# Patient Record
Sex: Male | Born: 1955
Health system: Southern US, Community
[De-identification: ages and names within clinical notes are randomized; demographics above are authoritative.]

## PROBLEM LIST (undated history)

## (undated) DIAGNOSIS — E785 Hyperlipidemia, unspecified: Secondary | ICD-10-CM

## (undated) DIAGNOSIS — K760 Fatty (change of) liver, not elsewhere classified: Secondary | ICD-10-CM

## (undated) DIAGNOSIS — I714 Abdominal aortic aneurysm, without rupture, unspecified: Secondary | ICD-10-CM

## (undated) DIAGNOSIS — E119 Type 2 diabetes mellitus without complications: Secondary | ICD-10-CM

## (undated) DIAGNOSIS — I2510 Atherosclerotic heart disease of native coronary artery without angina pectoris: Secondary | ICD-10-CM

## (undated) DIAGNOSIS — R7303 Prediabetes: Secondary | ICD-10-CM

## (undated) DIAGNOSIS — I2699 Other pulmonary embolism without acute cor pulmonale: Secondary | ICD-10-CM

## (undated) DIAGNOSIS — I723 Aneurysm of iliac artery: Secondary | ICD-10-CM

## (undated) HISTORY — PX: KNEE SURGERY: SHX244

## (undated) HISTORY — DX: Abdominal aortic aneurysm, without rupture: I71.4

## (undated) HISTORY — DX: Hyperlipidemia, unspecified: E78.5

## (undated) HISTORY — DX: Fatty (change of) liver, not elsewhere classified: K76.0

## (undated) HISTORY — DX: Prediabetes: R73.03

## (undated) HISTORY — DX: Aneurysm of iliac artery: I72.3

## (undated) HISTORY — DX: Abdominal aortic aneurysm, without rupture, unspecified: I71.40

---

## 2000-12-05 ENCOUNTER — Emergency Department (HOSPITAL_COMMUNITY): Admission: EM | Admit: 2000-12-05 | Discharge: 2000-12-05 | Payer: Self-pay | Admitting: Emergency Medicine

## 2000-12-05 ENCOUNTER — Encounter: Payer: Self-pay | Admitting: Emergency Medicine

## 2001-01-04 ENCOUNTER — Ambulatory Visit (HOSPITAL_BASED_OUTPATIENT_CLINIC_OR_DEPARTMENT_OTHER): Admission: RE | Admit: 2001-01-04 | Discharge: 2001-01-04 | Payer: Self-pay | Admitting: *Deleted

## 2015-03-08 ENCOUNTER — Emergency Department (HOSPITAL_COMMUNITY): Payer: Self-pay

## 2015-03-08 ENCOUNTER — Inpatient Hospital Stay (HOSPITAL_COMMUNITY)
Admission: EM | Admit: 2015-03-08 | Discharge: 2015-03-09 | DRG: 247 | Disposition: A | Discharge status: Home / Self Care | Payer: Self-pay | Attending: Internal Medicine | Admitting: Internal Medicine

## 2015-03-08 ENCOUNTER — Encounter (HOSPITAL_COMMUNITY)
Admission: EM | Disposition: A | Discharge status: Home / Self Care | Payer: Self-pay | Source: Home / Self Care | Attending: Internal Medicine

## 2015-03-08 ENCOUNTER — Encounter (HOSPITAL_COMMUNITY): Payer: Self-pay | Admitting: *Deleted

## 2015-03-08 DIAGNOSIS — Z87891 Personal history of nicotine dependence: Secondary | ICD-10-CM

## 2015-03-08 DIAGNOSIS — G4733 Obstructive sleep apnea (adult) (pediatric): Secondary | ICD-10-CM | POA: Diagnosis present

## 2015-03-08 DIAGNOSIS — R0602 Shortness of breath: Secondary | ICD-10-CM

## 2015-03-08 DIAGNOSIS — I200 Unstable angina: Secondary | ICD-10-CM

## 2015-03-08 DIAGNOSIS — R0600 Dyspnea, unspecified: Secondary | ICD-10-CM | POA: Diagnosis present

## 2015-03-08 DIAGNOSIS — Z955 Presence of coronary angioplasty implant and graft: Secondary | ICD-10-CM

## 2015-03-08 DIAGNOSIS — Z9119 Patient's noncompliance with other medical treatment and regimen: Secondary | ICD-10-CM | POA: Diagnosis present

## 2015-03-08 DIAGNOSIS — Z79899 Other long term (current) drug therapy: Secondary | ICD-10-CM

## 2015-03-08 DIAGNOSIS — I2510 Atherosclerotic heart disease of native coronary artery without angina pectoris: Secondary | ICD-10-CM

## 2015-03-08 DIAGNOSIS — Z6841 Body Mass Index (BMI) 40.0 and over, adult: Secondary | ICD-10-CM

## 2015-03-08 DIAGNOSIS — J449 Chronic obstructive pulmonary disease, unspecified: Secondary | ICD-10-CM | POA: Diagnosis present

## 2015-03-08 DIAGNOSIS — I25110 Atherosclerotic heart disease of native coronary artery with unstable angina pectoris: Principal | ICD-10-CM | POA: Diagnosis present

## 2015-03-08 DIAGNOSIS — E785 Hyperlipidemia, unspecified: Secondary | ICD-10-CM | POA: Diagnosis present

## 2015-03-08 DIAGNOSIS — Z9861 Coronary angioplasty status: Secondary | ICD-10-CM

## 2015-03-08 DIAGNOSIS — E119 Type 2 diabetes mellitus without complications: Secondary | ICD-10-CM | POA: Diagnosis present

## 2015-03-08 DIAGNOSIS — I2699 Other pulmonary embolism without acute cor pulmonale: Secondary | ICD-10-CM

## 2015-03-08 DIAGNOSIS — Z8249 Family history of ischemic heart disease and other diseases of the circulatory system: Secondary | ICD-10-CM

## 2015-03-08 DIAGNOSIS — Z7982 Long term (current) use of aspirin: Secondary | ICD-10-CM

## 2015-03-08 HISTORY — PX: CARDIAC CATHETERIZATION: SHX172

## 2015-03-08 HISTORY — DX: Atherosclerotic heart disease of native coronary artery without angina pectoris: I25.10

## 2015-03-08 HISTORY — DX: Other pulmonary embolism without acute cor pulmonale: I26.99

## 2015-03-08 LAB — BRAIN NATRIURETIC PEPTIDE: B Natriuretic Peptide: 5.7 pg/mL (ref 0.0–100.0)

## 2015-03-08 LAB — CBC
HCT: 41.9 % (ref 39.0–52.0)
Hemoglobin: 14.2 g/dL (ref 13.0–17.0)
MCH: 29.8 pg (ref 26.0–34.0)
MCHC: 33.9 g/dL (ref 30.0–36.0)
MCV: 88 fL (ref 78.0–100.0)
PLATELETS: 170 10*3/uL (ref 150–400)
RBC: 4.76 MIL/uL (ref 4.22–5.81)
RDW: 13.7 % (ref 11.5–15.5)
WBC: 7.2 10*3/uL (ref 4.0–10.5)

## 2015-03-08 LAB — HEPATIC FUNCTION PANEL
ALBUMIN: 3.9 g/dL (ref 3.5–5.0)
ALT: 45 U/L (ref 17–63)
AST: 28 U/L (ref 15–41)
Alkaline Phosphatase: 74 U/L (ref 38–126)
BILIRUBIN TOTAL: 1.3 mg/dL — AB (ref 0.3–1.2)
Bilirubin, Direct: 0.2 mg/dL (ref 0.1–0.5)
Indirect Bilirubin: 1.1 mg/dL — ABNORMAL HIGH (ref 0.3–0.9)
Total Protein: 7.2 g/dL (ref 6.5–8.1)

## 2015-03-08 LAB — BASIC METABOLIC PANEL
Anion gap: 8 (ref 5–15)
BUN: 14 mg/dL (ref 6–20)
CALCIUM: 9.4 mg/dL (ref 8.9–10.3)
CHLORIDE: 102 mmol/L (ref 101–111)
CO2: 25 mmol/L (ref 22–32)
CREATININE: 0.86 mg/dL (ref 0.61–1.24)
GFR calc Af Amer: >60 mL/min (ref >60)
GFR calc non Af Amer: >60 mL/min (ref >60)
GLUCOSE: 113 mg/dL — AB (ref 65–99)
Potassium: 4.2 mmol/L (ref 3.5–5.1)
Sodium: 135 mmol/L (ref 135–145)

## 2015-03-08 LAB — TROPONIN I
Troponin I: <0.03 ng/mL (ref <0.031)
Troponin I: <0.03 ng/mL (ref <0.031)

## 2015-03-08 LAB — I-STAT TROPONIN, ED: TROPONIN I, POC: 0 ng/mL (ref 0.00–0.08)

## 2015-03-08 LAB — MRSA PCR SCREENING: MRSA BY PCR: NEGATIVE

## 2015-03-08 SURGERY — LEFT HEART CATH AND CORONARY ANGIOGRAPHY
Anesthesia: LOCAL

## 2015-03-08 MED ORDER — ATORVASTATIN CALCIUM 80 MG PO TABS: 80.0000 mg | ORAL_TABLET | Freq: Every day | ORAL | Status: DC

## 2015-03-08 MED ORDER — NITROGLYCERIN 0.4 MG SL SUBL
0.4000 mg | SUBLINGUAL_TABLET | SUBLINGUAL | Status: DC | PRN
  Administered 2015-03-08: 0.4 mg via SUBLINGUAL

## 2015-03-08 MED ORDER — VERAPAMIL HCL 2.5 MG/ML IV SOLN
INTRAVENOUS | Status: DC | PRN
  Administered 2015-03-08: 10 mL via INTRA_ARTERIAL

## 2015-03-08 MED ORDER — SODIUM CHLORIDE 0.9 % IJ SOLN: 3.0000 mL | INTRAMUSCULAR | Status: DC | PRN

## 2015-03-08 MED ORDER — MIDAZOLAM HCL 2 MG/2ML IJ SOLN
INTRAMUSCULAR | Status: DC | PRN
  Administered 2015-03-08: 1 mg via INTRAVENOUS

## 2015-03-08 MED ORDER — FENTANYL CITRATE (PF) 100 MCG/2ML IJ SOLN
INTRAMUSCULAR | Status: AC
  Filled 2015-03-08: qty 4

## 2015-03-08 MED ORDER — ACETAMINOPHEN 650 MG RE SUPP: 650.0000 mg | Freq: Four times a day (QID) | RECTAL | Status: DC | PRN

## 2015-03-08 MED ORDER — SODIUM CHLORIDE 0.9 % IV SOLN
250.0000 mg | INTRAVENOUS | Status: DC | PRN
  Administered 2015-03-08: 1.75 mg/kg/h via INTRAVENOUS

## 2015-03-08 MED ORDER — BIVALIRUDIN 250 MG IV SOLR
INTRAVENOUS | Status: AC
  Filled 2015-03-08: qty 250

## 2015-03-08 MED ORDER — ENOXAPARIN SODIUM 40 MG/0.4ML ~~LOC~~ SOLN: 40.0000 mg | SUBCUTANEOUS | Status: DC

## 2015-03-08 MED ORDER — IPRATROPIUM-ALBUTEROL 0.5-2.5 (3) MG/3ML IN SOLN
3.0000 mL | Freq: Once | RESPIRATORY_TRACT | Status: AC
  Administered 2015-03-08: 3 mL via RESPIRATORY_TRACT
  Filled 2015-03-08: qty 3

## 2015-03-08 MED ORDER — ONDANSETRON HCL 4 MG PO TABS: 4.0000 mg | ORAL_TABLET | Freq: Four times a day (QID) | ORAL | Status: DC | PRN

## 2015-03-08 MED ORDER — ENOXAPARIN SODIUM 40 MG/0.4ML ~~LOC~~ SOLN
40.0000 mg | SUBCUTANEOUS | Status: DC
  Administered 2015-03-09: 40 mg via SUBCUTANEOUS
  Filled 2015-03-08: qty 0.4

## 2015-03-08 MED ORDER — METHYLPREDNISOLONE SODIUM SUCC 125 MG IJ SOLR
125.0000 mg | Freq: Once | INTRAMUSCULAR | Status: AC
  Administered 2015-03-08: 125 mg via INTRAVENOUS
  Filled 2015-03-08: qty 2

## 2015-03-08 MED ORDER — BIVALIRUDIN BOLUS VIA INFUSION - CUPID
INTRAVENOUS | Status: DC | PRN
  Administered 2015-03-08: 116.7 mg via INTRAVENOUS

## 2015-03-08 MED ORDER — SODIUM CHLORIDE 0.9 % IV SOLN: 250.0000 mL | INTRAVENOUS | Status: DC | PRN

## 2015-03-08 MED ORDER — TICAGRELOR 90 MG PO TABS
ORAL_TABLET | ORAL | Status: DC | PRN
  Administered 2015-03-08: 180 mg via ORAL

## 2015-03-08 MED ORDER — ASPIRIN EC 81 MG PO TBEC
81.0000 mg | DELAYED_RELEASE_TABLET | Freq: Every day | ORAL | Status: DC
  Administered 2015-03-09: 81 mg via ORAL
  Filled 2015-03-08: qty 1

## 2015-03-08 MED ORDER — HEPARIN (PORCINE) IN NACL 2-0.9 UNIT/ML-% IJ SOLN
INTRAMUSCULAR | Status: AC
  Filled 2015-03-08: qty 1500

## 2015-03-08 MED ORDER — ONDANSETRON HCL 4 MG/2ML IJ SOLN: 4.0000 mg | Freq: Four times a day (QID) | INTRAMUSCULAR | Status: DC | PRN

## 2015-03-08 MED ORDER — TICAGRELOR 90 MG PO TABS
90.0000 mg | ORAL_TABLET | Freq: Two times a day (BID) | ORAL | Status: DC
  Administered 2015-03-09: 90 mg via ORAL
  Filled 2015-03-08: qty 1

## 2015-03-08 MED ORDER — IOHEXOL 350 MG/ML SOLN
INTRAVENOUS | Status: DC | PRN
  Administered 2015-03-08: 190 mL via INTRACARDIAC

## 2015-03-08 MED ORDER — MIDAZOLAM HCL 2 MG/2ML IJ SOLN
INTRAMUSCULAR | Status: AC
  Filled 2015-03-08: qty 4

## 2015-03-08 MED ORDER — NITROGLYCERIN 1 MG/10 ML FOR IR/CATH LAB
INTRA_ARTERIAL | Status: DC | PRN
  Administered 2015-03-08: 19:00:00

## 2015-03-08 MED ORDER — NITROGLYCERIN 0.4 MG SL SUBL
SUBLINGUAL_TABLET | SUBLINGUAL | Status: AC
  Filled 2015-03-08: qty 1

## 2015-03-08 MED ORDER — SODIUM CHLORIDE 0.9 % IJ SOLN
3.0000 mL | Freq: Two times a day (BID) | INTRAMUSCULAR | Status: DC
  Administered 2015-03-09: 3 mL via INTRAVENOUS

## 2015-03-08 MED ORDER — SODIUM CHLORIDE 0.9 % WEIGHT BASED INFUSION
1.0000 mL/kg/h | INTRAVENOUS | Status: AC
  Administered 2015-03-08: 1 mL/kg/h via INTRAVENOUS

## 2015-03-08 MED ORDER — LIDOCAINE HCL (PF) 1 % IJ SOLN
INTRAMUSCULAR | Status: AC
  Filled 2015-03-08: qty 30

## 2015-03-08 MED ORDER — TICAGRELOR 90 MG PO TABS: 90.0000 mg | ORAL_TABLET | Freq: Two times a day (BID) | ORAL | Status: DC

## 2015-03-08 MED ORDER — HEPARIN SODIUM (PORCINE) 1000 UNIT/ML IJ SOLN
INTRAMUSCULAR | Status: AC
  Filled 2015-03-08: qty 1

## 2015-03-08 MED ORDER — NITROGLYCERIN 1 MG/10 ML FOR IR/CATH LAB
INTRA_ARTERIAL | Status: DC | PRN
  Administered 2015-03-08: 200 ug

## 2015-03-08 MED ORDER — FENTANYL CITRATE (PF) 100 MCG/2ML IJ SOLN
INTRAMUSCULAR | Status: DC | PRN
  Administered 2015-03-08: 25 ug via INTRAVENOUS

## 2015-03-08 MED ORDER — SODIUM CHLORIDE 0.9 % IV SOLN
1.7500 mg/kg/h | INTRAVENOUS | Status: DC
  Filled 2015-03-08: qty 250

## 2015-03-08 MED ORDER — ACETAMINOPHEN 325 MG PO TABS: 650.0000 mg | ORAL_TABLET | Freq: Four times a day (QID) | ORAL | Status: DC | PRN

## 2015-03-08 MED ORDER — HEPARIN SODIUM (PORCINE) 1000 UNIT/ML IJ SOLN
INTRAMUSCULAR | Status: DC | PRN
  Administered 2015-03-08: 6000 [IU] via INTRAVENOUS

## 2015-03-08 MED ORDER — ASPIRIN 81 MG PO CHEW: 81.0000 mg | CHEWABLE_TABLET | Freq: Every day | ORAL | Status: DC

## 2015-03-08 MED ORDER — TICAGRELOR 90 MG PO TABS
ORAL_TABLET | ORAL | Status: AC
  Filled 2015-03-08: qty 2

## 2015-03-08 SURGICAL SUPPLY — 21 items
BALLN EMERGE MR 2.5X20 (BALLOONS) ×2
BALLN ~~LOC~~ EMERGE MR 3.75X20 (BALLOONS) ×2
BALLOON EMERGE MR 2.5X20 (BALLOONS) IMPLANT
BALLOON ~~LOC~~ EMERGE MR 3.75X20 (BALLOONS) IMPLANT
CATH INFINITI 5 FR JL3.5 (CATHETERS) ×2 IMPLANT
CATH INFINITI 5FR ANG PIGTAIL (CATHETERS) ×2 IMPLANT
CATH INFINITI JR4 5F (CATHETERS) ×2 IMPLANT
DEVICE RAD COMP TR BAND LRG (VASCULAR PRODUCTS) ×2 IMPLANT
GLIDESHEATH SLEND SS 6F .021 (SHEATH) ×2 IMPLANT
GUIDE CATH RUNWAY 6FR AL 1 (CATHETERS) ×1 IMPLANT
GUIDE CATH RUNWAY 6FR AL 75 (CATHETERS) ×1 IMPLANT
KIT ENCORE 26 ADVANTAGE (KITS) ×1 IMPLANT
KIT HEART LEFT (KITS) ×2 IMPLANT
PACK CARDIAC CATHETERIZATION (CUSTOM PROCEDURE TRAY) ×2 IMPLANT
STENT PROMUS PREM MR 3.0X38 (Permanent Stent) ×1 IMPLANT
STENT PROMUS PREM MR 3.5X38 (Permanent Stent) ×1 IMPLANT
SYR MEDRAD MARK V 150ML (SYRINGE) ×2 IMPLANT
TRANSDUCER W/STOPCOCK (MISCELLANEOUS) ×2 IMPLANT
TUBING CIL FLEX 10 FLL-RA (TUBING) ×2 IMPLANT
WIRE ASAHI PROWATER 180CM (WIRE) ×1 IMPLANT
WIRE SAFE-T 1.5MM-J .035X260CM (WIRE) ×2 IMPLANT

## 2015-03-08 NOTE — ED Provider Notes (Signed)
CSN: [id number]     Arrival date & time 03/08/15  1012 History   None    Chief Complaint  Patient presents with  . Chest Pain  . Shortness of Breath    HPI  Cory Nolan is a 59 y.o. male presenting to the ED for chest pain and shortness of breath. Patient states that symptoms started about 2 weeks ago. He went out of town and states that ever since then he has been short of breath. He states that he is winded with exertion. This is not his usual baseline. Patient is a former smoker denies cigarette smoking still does nicotine lozenges and shoes. He states that along with shortness of breath he was getting intermittent chest is aches. He denies any pain. These aches were left-sided and would come and go. He also endorses left arm pain that started yesterday. States her symptoms are concerning last night around 6 PM when he started to have left arm pain that dissipated and then turned into the left chest aching. And then he was filling his heart throbbing. He states that he also felt some discomfort in his back. Patient states that he takes a baby aspirin every day and an antacid.  He states he still able to get around [place] to [place] but he just realizes he gets winded more quickly.  Also of note patient endorses a 25-30lb went gain recently. He states he has been eating healthier so unsure where weight gain is coming from.    History reviewed. No pertinent past medical history. Past Surgical History  Procedure Laterality Date  . Knee surgery     No family history on file. Social History  Substance Use Topics  . Smoking status: Former Smoker    Quit date: 01/09/2015  . Smokeless tobacco: Current User    Types: Chew  . Alcohol Use: Yes     Comment: occ    Review of Systems  Constitutional: Positive for unexpected weight change. Negative for fever.  Respiratory: Positive for shortness of breath. Negative for wheezing.   Cardiovascular: Positive for chest  pain.  Gastrointestinal: Negative for nausea, vomiting and abdominal pain.  Also per HPI  Allergies  Review of patient's allergies indicates no known allergies.  Home Medications   Prior to Admission medications   Medication Sig Start Date End Date Taking? Authorizing Provider  aspirin EC 81 MG tablet Take 81 mg by mouth daily.   Yes Historical Provider, MD  Glucosamine HCl (GLUCOSAMINE PO) Take 1 capsule by mouth daily.   Yes Historical Provider, MD  hydrocortisone cream 1 % Apply 1 application topically daily as needed for itching (eczema on feet).   Yes Historical Provider, MD  naproxen sodium (ALEVE) 220 MG tablet Take 440 mg by mouth daily as needed (pain).   Yes Historical Provider, MD  nicotine polacrilex (COMMIT) 4 MG lozenge Take 4 mg by mouth every 4 (four) hours as needed (nervousness).   Yes Historical Provider, MD  Powders (ANTI MONKEY BUTT EX) Apply 1 application topically daily as needed (friction burns on upper legs).   Yes Historical Provider, MD  PRESCRIPTION MEDICATION Place 3-4 drops into the left eye 5 (five) times daily as needed (dry eyes). Lubricating eye drop from [facility] (samples)   Yes Historical Provider, MD  ranitidine (ZANTAC) 150 MG tablet Take 150-300 mg by mouth daily as needed for heartburn (also take with naproxen).    Yes Historical Provider, MD   BP  114/76 mmHg  Pulse 71  Temp(Src) 98.3 F (36.8 C) (Oral)  Resp 12  Ht  (1.905 m)  Wt 344 lb (156.037 kg)  BMI 43.00 kg/m2  SpO2 97% Physical Exam  Constitutional: He is oriented to person, place, and time. He appears well-developed and well-nourished. No distress.  Eyes:  Anophthalmia of left eye  Cardiovascular: Normal rate and regular rhythm.   Pulmonary/Chest: Effort normal and breath sounds normal. He exhibits tenderness.  Abdominal: Soft. Bowel sounds are normal. There is no tenderness.  Musculoskeletal: He exhibits edema.  1+ pitting edema of BLE  Neurological: He is alert and  oriented to person, place, and time.  Skin: Skin is warm and dry.    ED Course  Procedures (including critical care time) Labs Review Labs Reviewed  BASIC METABOLIC PANEL - Abnormal; Notable for the following:    Glucose, Bld 113 (*)    All other components within normal limits  CBC  BRAIN NATRIURETIC PEPTIDE  I-STAT TROPOININ, ED    Imaging Review Dg Chest 2 View  03/08/2015   CLINICAL DATA:  LEFT-sided chest pain began 1 day ago. Shortness of breath intermittently. Ex smoker.  EXAM: CHEST  2 VIEW  COMPARISON:  None.  FINDINGS: Hyperinflation suggesting COPD. No nodule, infiltrate, or edema. Normal cardiomediastinal silhouette. Thoracic atherosclerosis. Degenerative change in the thoracic spine.  IMPRESSION: No active cardiopulmonary disease.COPD.   Electronically Signed   By: Elsie Stain M.D.   On: 03/08/2015 11:23   I have personally reviewed and evaluated these images and lab results as part of my medical decision-making.   EKG Interpretation   Date/Time:  Friday March 08 2015 10:19:07 EDT Ventricular Rate:  81 PR Interval:  164 QRS Duration: 88 QT Interval:  360 QTC Calculation: 418 R Axis:   -25 Text Interpretation:  Normal sinus rhythm Inferior infarct , age  undetermined Abnormal ECG Confirmed by LITTLE MD, RACHEL ([id number]) on  03/08/2015 11:01:14 AM      MDM   Final diagnoses:  None    Patient presented to ED with symptoms of dyspnea and chest pain. Differential at time of presentation included CHF, COPD exacerbation, ACS, and angina.   No signs of ACS. EKG unremarkable and troponin negative. BNP was not elevated. CXR without an acute processes, however COPD was appreciated.   Gave duoneb treatment and solumedrol for possible COPD exacerbation. No real improvement.  Will admit patient for chest pain rule out. Heart score was calculated to be 4 (possible higher but patient unaware of any medical conditions). Believe patient at moderate risk.    Caryl Ada, DO 03/08/2015, 1:39 PM PGY-2, [facility]    Pincus Large, DO 03/08/15 1414  Laurence Spates, MD 03/08/15 [phone]

## 2015-03-08 NOTE — ED Notes (Signed)
Called lab about BNP - will add-on and run.

## 2015-03-08 NOTE — ED Notes (Signed)
Patient transported to X-ray 

## 2015-03-08 NOTE — Progress Notes (Signed)
eLink Physician-Brief Progress Note Patient Name: Cory Nolan DOB: 1956/01/18 MRN: [id number]   Date of Service  03/08/2015  HPI/Events of Note  New patient evaluation s/p LHC & stent for dyspnea & CAD. On ASA, Brilinta, & Lipitor. Patient morbidly obese. Lovenox ppx.  eICU Interventions  No interventions.     Intervention Category Evaluation Type: New Patient Evaluation  Lawanda Cousins 03/08/2015, 8:14 PM

## 2015-03-08 NOTE — ED Notes (Signed)
Pt states L sided chest pain that radiates to L arm.  Sob that increases with activity.

## 2015-03-08 NOTE — Progress Notes (Signed)
Pt transferred to HA waiting for cath procedure alert and oriented X4. Pt denies any discomfort at this time.  SL in left AC and hand intact.  Pt on the monitor until ready for procedure.

## 2015-03-08 NOTE — Consult Note (Signed)
CARDIOLOGY CONSULT NOTE  Reason for Consultation: CP   HPI:  59 y/o with morbid obesity, previous tobacco use quit 2009, OSA and FHx CAD.   Says about one moth ago was active without problems. However 2 weeks ago was in [place] got out of truck and had fairly abrupt onset of exertional dyspnea. This has persisted over past two weeks. Has also had exertional chest pressure. Last night chest pressure worse - unable to sleep. Radiated to L arm and back. + dyspnea.   In ER ECG and troponin are normal. CP improved with NTG     Review of Systems:     Cardiac Review of Systems: {Y] = yes  = no  Chest Pain [ y   ]  Resting SOB [place].Etienne   ] Exertional SOB  [ y ]  Orthopnea [  ]   Pedal Edema [   ]    Palpitations [  ] Syncope  [  ]   Presyncope [   ]  General Review of Systems: [Y] = yes [  ]=no Constitional: recent weight change [  ]; anorexia [  ]; fatigue [  ]; nausea [  ]; night sweats [  ]; fever [  ]; or chills [  ];                                                                     Eyes : blurred vision [  ]; diplopia [   ]; vision changes [  ];  Amaurosis fugax[  ]; Resp: cough [  ];  wheezing[  ];  hemoptysis[  ];  PND [  ];  GI:  gallstones[  ], vomiting[  ];  dysphagia[  ]; melena[  ];  hematochezia [  ]; heartburn[  ];   GU: kidney stones [  ]; hematuria[  ];   dysuria [  ];  nocturia[  ]; incontinence [  ];             Skin: rash, swelling[  ];, hair loss[  ];  peripheral edema[  ];  or itching[  ]; Musculosketetal: myalgias[  ];  joint swelling[  ];  joint erythema[  ];  joint pain[y  ];  back pain[  ];  Heme/Lymph: bruising[  ];  bleeding[  ];  anemia[  ];  Neuro: TIA[  ];  headaches[  ];  stroke[  ];  vertigo[  ];  seizures[  ];   paresthesias[  ];  difficulty walking[  ];  Psych:depression[  ]; anxiety[  ];  Endocrine: diabetes[  ];  thyroid dysfunction[  ];  Other:  History reviewed. No pertinent past medical history.  Medications Prior to Admission  Medication Sig  Dispense Refill  . aspirin EC 81 MG tablet Take 81 mg by mouth daily.    . Glucosamine HCl (GLUCOSAMINE PO) Take 1 capsule by mouth daily.    . hydrocortisone cream 1 % Apply 1 application topically daily as needed for itching (eczema on feet).    . naproxen sodium (ALEVE) 220 MG tablet Take 440 mg by mouth daily as needed (pain).    . nicotine polacrilex (COMMIT) 4 MG lozenge Take 4 mg by mouth every 4 (four) hours as needed (  nervousness).    . Powders (ANTI MONKEY BUTT EX) Apply 1 application topically daily as needed (friction burns on upper legs).    Marland Kitchen PRESCRIPTION MEDICATION Place 3-4 drops into the left eye 5 (five) times daily as needed (dry eyes). Lubricating eye drop from [facility] (samples)    . ranitidine (ZANTAC) 150 MG tablet Take 150-300 mg by mouth daily as needed for heartburn (also take with naproxen).        Melene Muller ON 03/09/2015] aspirin EC  81 mg Oral Daily  . enoxaparin (LOVENOX) injection  40 mg Subcutaneous Q24H  . nitroGLYCERIN        Infusions:    No Known Allergies  Social History   Social History  . Marital Status: Unknown    Spouse Name: N/A  . Number of Children: N/A  . Years of Education: N/A   Occupational History  . Not on file.   Social History Main Topics  . Smoking status: Former Smoker    Quit date: 01/09/2015  . Smokeless tobacco: Current User    Types: Chew  . Alcohol Use: Yes     Comment: occ  . Drug Use: Not on file  . Sexual Activity: Not on file   Other Topics Concern  . Not on file   Social History Narrative  . No narrative on file    Family Hx:  mother died bone ca Father  died brain hemorrhage +cad GF died at 80 from MI  PHYSICAL EXAM: Filed Vitals:   03/08/15 1526  BP: 119/73  Pulse: 70  Temp: 97.5 F (36.4 C)  Resp: 18    No intake or output data in the 24 hours ending 03/08/15 1632  General:  Well appearing. No respiratory difficulty HEENT: normal Neck: supple. Thick unable to assess JVD.  Carotids 2+ bilat; no bruits. No lymphadenopathy or thryomegaly appreciated. Cor: PMI nondisplaced. Regular rate & rhythm. No rubs, gallops or murmurs. Lungs: clear Abdomen: obese soft, nontender, nondistended. No hepatosplenomegaly. No bruits or masses. Good bowel sounds. Extremities: no cyanosis, clubbing, rash, edema Neuro: alert & oriented x 3, cranial nerves grossly intact. moves all 4 extremities w/o difficulty. Affect pleasant.  ECG: NSR No ST-T wave abnormalities.    Results for orders placed or performed during the hospital encounter of 03/08/15 (from the past 24 hour(s))  Basic metabolic panel     Status: Abnormal   Collection Time: 03/08/15 10:35 AM  Result Value Ref Range   Sodium 135 135 - 145 mmol/L   Potassium 4.2 3.5 - 5.1 mmol/L   Chloride 102 101 - 111 mmol/L   CO2 25 22 - 32 mmol/L   Glucose, Bld 113 (H) 65 - 99 mg/dL   BUN 14 6 - 20 mg/dL   Creatinine, Ser 2.13 0.61 - 1.24 mg/dL   Calcium 9.4 8.9 - 08.6 mg/dL   GFR calc non Af Amer >60 >60 mL/min   GFR calc Af Amer >60 >60 mL/min   Anion gap 8 5 - 15  CBC     Status: None   Collection Time: 03/08/15 10:35 AM  Result Value Ref Range   WBC 7.2 4.0 - 10.5 K/uL   RBC 4.76 4.22 - 5.81 MIL/uL   Hemoglobin 14.2 13.0 - 17.0 g/dL   HCT 57.8 46.9 - 62.9 %   MCV 88.0 78.0 - 100.0 fL   MCH 29.8 26.0 - 34.0 pg   MCHC 33.9 30.0 - 36.0 g/dL   RDW 52.8 41.3 - 24.4 %  Platelets 170 150 - 400 K/uL  Brain natriuretic peptide     Status: None   Collection Time: 03/08/15 10:35 AM  Result Value Ref Range   B Natriuretic Peptide 5.7 0.0 - 100.0 pg/mL  I-stat troponin, ED     Status: None   Collection Time: 03/08/15 10:46 AM  Result Value Ref Range   Troponin i, poc 0.00 0.00 - 0.08 ng/mL   Comment 3           Dg Chest 2 View  03/08/2015   CLINICAL DATA:  LEFT-sided chest pain began 1 day ago. Shortness of breath intermittently. Ex smoker.  EXAM: CHEST  2 VIEW  COMPARISON:  None.  FINDINGS: Hyperinflation suggesting COPD.  No nodule, infiltrate, or edema. Normal cardiomediastinal silhouette. Thoracic atherosclerosis. Degenerative change in the thoracic spine.  IMPRESSION: No active cardiopulmonary disease.COPD.   Electronically Signed   By: Elsie Stain M.D.   On: 03/08/2015 11:23     ASSESSMENT: 1. Unstable angina 2. Morbid obesity 3. OSA noncompliant with CPAP 4. Former tobacco use quit 2009  5. Probable COPD  PLAN/DISCUSSION:  CP very concerning for [place]. Will plan cath. He agrees,. We will to schedule the procedure for this afternoon if possible.  Bensimhon, Daniel,MD 4:33 PM

## 2015-03-08 NOTE — ED Notes (Signed)
Report given to RN on 3E.

## 2015-03-08 NOTE — H&P (Signed)
[facility] History and Physical  Cory Nolan WUJ:[id number] DOB: [birth year] DOA: 03/08/2015  Referring physician: EDP PCP: No primary care provider on file.   Chief Complaint: SOB SINCE 2 weeks and chest pain since yesterday.   HPI: Cory Nolan is a 59 y.o. male with h/o depression has been on wellbutrin in sthe past, and not anymore, alcohol use, comes inf or 2 weeks of sob on exertion and chest discomfort, pressure since yesterday with left arm numbness since last night. He denies any other complaints. When i saw and examined the patient, he reports 4/10 chest discomfort. He was referred to medical service for admission. Cardiology consulted for recommendations.     Review of Systems:  Constitutional:  No weight loss, night sweats, Fevers, chills, fatigue.  HEENT:  No headaches, Difficulty swallowing,Tooth/dental problems,Sore throat,  No sneezing, itching, ear ache, nasal congestion, post nasal drip,  Cardio-vascular:  Chest pain and sob on exertion. No syncope.  GI:  No heartburn, indigestion, abdominal pain, nausea, vomiting, diarrhea, change in bowel habits, loss of appetite  Resp:  No shortness of breath with exertion or at rest. No excess mucus, no productive cough, No non-productive cough, No coughing up of blood.No change in color of mucus.No wheezing.No chest wall deformity  Skin:  no rash or lesions.  GU:  no dysuria, change in color of urine, no urgency or frequency. No flank pain.  Musculoskeletal:  No joint pain or swelling. No decreased range of motion. No back pain.  Psych:  No change in mood or affect. No depression or anxiety. No memory loss.   History reviewed. No pertinent past medical history. Past Surgical History  Procedure Laterality Date  . Knee surgery     Social History:  reports that he quit smoking about 8 weeks ago. His smokeless tobacco use includes Chew. He reports that he drinks alcohol. His drug history is not on file.  No  Known Allergies  No family history on file.  f Ather had Brain hemorrhage, may have heart disease.  Mother cancer.    Prior to Admission medications   Medication Sig Start Date End Date Taking? Authorizing Provider  aspirin EC 81 MG tablet Take 81 mg by mouth daily.   Yes Historical Provider, MD  Glucosamine HCl (GLUCOSAMINE PO) Take 1 capsule by mouth daily.   Yes Historical Provider, MD  hydrocortisone cream 1 % Apply 1 application topically daily as needed for itching (eczema on feet).   Yes Historical Provider, MD  naproxen sodium (ALEVE) 220 MG tablet Take 440 mg by mouth daily as needed (pain).   Yes Historical Provider, MD  nicotine polacrilex (COMMIT) 4 MG lozenge Take 4 mg by mouth every 4 (four) hours as needed (nervousness).   Yes Historical Provider, MD  Powders (ANTI MONKEY BUTT EX) Apply 1 application topically daily as needed (friction burns on upper legs).   Yes Historical Provider, MD  PRESCRIPTION MEDICATION Place 3-4 drops into the left eye 5 (five) times daily as needed (dry eyes). Lubricating eye drop from [facility] (samples)   Yes Historical Provider, MD  ranitidine (ZANTAC) 150 MG tablet Take 150-300 mg by mouth daily as needed for heartburn (also take with naproxen).    Yes Historical Provider, MD   Physical Exam: Filed Vitals:   03/08/15 1400 03/08/15 1415 03/08/15 1430 03/08/15 1526  BP: 120/80 111/60 111/74 119/73  Pulse: 69 74 71 70  Temp:    97.5 F (36.4 C)  TempSrc:  Oral  Resp: Height:     (1.905 m)  Weight:    64.638 kg (142 lb 8 oz)  SpO2: 92% 94% 90% 97%    Wt Readings from Last 3 Encounters:  03/08/15 64.638 kg (142 lb 8 oz)    General:  Appears calm and comfortable Eyes: PERRL, normal lids, irises & conjunctiva Neck: no LAD, masses or thyromegaly Cardiovascular: RRR, no m/r/g. No LE edema. Telemetry: SR, no arrhythmias  Respiratory: CTA bilaterally, no w/r/r. Normal respiratory effort. Abdomen: soft, ntnd Skin:  no rash or induration seen on limited exam Musculoskeletal: grossly normal tone BUE/BLE Psychiatric: grossly normal mood and affect, speech fluent and appropriate Neurologic: grossly non-focal.          Labs on Admission:  Basic Metabolic Panel:  Recent Labs Lab 03/08/15 1035  NA 135  K 4.2  CL 102  CO2 25  GLUCOSE 113*  BUN 14  CREATININE 0.86  CALCIUM 9.4   Liver Function Tests: No results for input(s): AST, ALT, ALKPHOS, BILITOT, PROT, ALBUMIN in the last 168 hours. No results for input(s): LIPASE, AMYLASE in the last 168 hours. No results for input(s): AMMONIA in the last 168 hours. CBC:  Recent Labs Lab 03/08/15 1035  WBC 7.2  HGB 14.2  HCT 41.9  MCV 88.0  PLT 170   Cardiac Enzymes: No results for input(s): CKTOTAL, CKMB, CKMBINDEX, TROPONINI in the last 168 hours.  BNP (last 3 results)  Recent Labs  03/08/15 1035  BNP 5.7    ProBNP (last 3 results) No results for input(s): PROBNP in the last 8760 hours.  CBG: No results for input(s): GLUCAP in the last 168 hours.  Radiological Exams on Admission: Dg Chest 2 View  03/08/2015   CLINICAL DATA:  LEFT-sided chest pain began 1 day ago. Shortness of breath intermittently. Ex smoker.  EXAM: CHEST  2 VIEW  COMPARISON:  None.  FINDINGS: Hyperinflation suggesting COPD. No nodule, infiltrate, or edema. Normal cardiomediastinal silhouette. Thoracic atherosclerosis. Degenerative change in the thoracic spine.  IMPRESSION: No active cardiopulmonary disease.COPD.   Electronically Signed   By: Elsie Stain M.D.   On: 03/08/2015 11:23    EKG: Independently reviewed.NSR.   Assessment/Plan Active Problems:   Dyspnea   SOB (shortness of breath)   SOB on exertion and chest discomfort with left arm numbness persistent: Unstable angina.  Ordered NTG and repeat EKG.  Cardiology consulted and plan for cath later today.  Serial troponins and echocardiogram.  Lipid panel and hgba1c ordered.  CXR reviewed , no acute  disease.   Depression: No suicidal ideation.    Code Status: full code.  DVT Prophylaxis: Family Communication: none at bedside.  Disposition Plan: admit to telemetry, in 1 to 2 days.   Time spent: 55 min  [facility] Pager [phone]

## 2015-03-08 NOTE — Progress Notes (Signed)
1515 pt arrived from the ED. Via stretcher.  Oriented to room.  Call bell at reach.  Instructed to call for assistance as needed.  Verbalized understanding.  Will continue to monitor.  Amanda Pea, Charity fundraiser.

## 2015-03-08 NOTE — H&P (View-Only) (Signed)
CARDIOLOGY CONSULT NOTE  Reason for Consultation: CP   HPI:  59 y/o with morbid obesity, previous tobacco use quit 2009, OSA and FHx CAD.   Says about one moth ago was active without problems. However 2 weeks ago was in [place] got out of truck and had fairly abrupt onset of exertional dyspnea. This has persisted over past two weeks. Has also had exertional chest pressure. Last night chest pressure worse - unable to sleep. Radiated to L arm and back. + dyspnea.   In ER ECG and troponin are normal. CP improved with NTG     Review of Systems:     Cardiac Review of Systems: {Y] = yes  = no  Chest Pain [ y   ]  Resting SOB [place]ienne   ] Exertional SOB  [ y ]  Orthopnea [  ]   Pedal Edema [   ]    Palpitations [  ] Syncope  [  ]   Presyncope [   ]  General Review of Systems: [Y] = yes [  ]=no Constitional: recent weight change [  ]; anorexia [  ]; fatigue [  ]; nausea [  ]; night sweats [  ]; fever [  ]; or chills [  ];                                                                     Eyes : blurred vision [  ]; diplopia [   ]; vision changes [  ];  Amaurosis fugax[  ]; Resp: cough [  ];  wheezing[  ];  hemoptysis[  ];  PND [  ];  GI:  gallstones[  ], vomiting[  ];  dysphagia[  ]; melena[  ];  hematochezia [  ]; heartburn[  ];   GU: kidney stones [  ]; hematuria[  ];   dysuria [  ];  nocturia[  ]; incontinence [  ];             Skin: rash, swelling[  ];, hair loss[  ];  peripheral edema[  ];  or itching[  ]; Musculosketetal: myalgias[  ];  joint swelling[  ];  joint erythema[  ];  joint pain[y  ];  back pain[  ];  Heme/Lymph: bruising[  ];  bleeding[  ];  anemia[  ];  Neuro: TIA[  ];  headaches[  ];  stroke[  ];  vertigo[  ];  seizures[  ];   paresthesias[  ];  difficulty walking[  ];  Psych:depression[  ]; anxiety[  ];  Endocrine: diabetes[  ];  thyroid dysfunction[  ];  Other:  History reviewed. No pertinent past medical history.  Medications Prior to Admission  Medication Sig  Dispense Refill  . aspirin EC 81 MG tablet Take 81 mg by mouth daily.    . Glucosamine HCl (GLUCOSAMINE PO) Take 1 capsule by mouth daily.    . hydrocortisone cream 1 % Apply 1 application topically daily as needed for itching (eczema on feet).    . naproxen sodium (ALEVE) 220 MG tablet Take 440 mg by mouth daily as needed (pain).    . nicotine polacrilex (COMMIT) 4 MG lozenge Take 4 mg by mouth every 4 (four) hours as needed (  nervousness).    . Powders (ANTI MONKEY BUTT EX) Apply 1 application topically daily as needed (friction burns on upper legs).    Marland Kitchen PRESCRIPTION MEDICATION Place 3-4 drops into the left eye 5 (five) times daily as needed (dry eyes). Lubricating eye drop from [facility] (samples)    . ranitidine (ZANTAC) 150 MG tablet Take 150-300 mg by mouth daily as needed for heartburn (also take with naproxen).        Melene Muller ON 03/09/2015] aspirin EC  81 mg Oral Daily  . enoxaparin (LOVENOX) injection  40 mg Subcutaneous Q24H  . nitroGLYCERIN        Infusions:    No Known Allergies  Social History   Social History  . Marital Status: Unknown    Spouse Name: N/A  . Number of Children: N/A  . Years of Education: N/A   Occupational History  . Not on file.   Social History Main Topics  . Smoking status: Former Smoker    Quit date: 01/09/2015  . Smokeless tobacco: Current User    Types: Chew  . Alcohol Use: Yes     Comment: occ  . Drug Use: Not on file  . Sexual Activity: Not on file   Other Topics Concern  . Not on file   Social History Narrative  . No narrative on file    Family Hx:  mother died bone ca Father  died brain hemorrhage +cad GF died at 80 from MI  PHYSICAL EXAM: Filed Vitals:   03/08/15 1526  BP: 119/73  Pulse: 70  Temp: 97.5 F (36.4 C)  Resp: 18    No intake or output data in the 24 hours ending 03/08/15 1632  General:  Well appearing. No respiratory difficulty HEENT: normal Neck: supple. Thick unable to assess JVD.  Carotids 2+ bilat; no bruits. No lymphadenopathy or thryomegaly appreciated. Cor: PMI nondisplaced. Regular rate & rhythm. No rubs, gallops or murmurs. Lungs: clear Abdomen: obese soft, nontender, nondistended. No hepatosplenomegaly. No bruits or masses. Good bowel sounds. Extremities: no cyanosis, clubbing, rash, edema Neuro: alert & oriented x 3, cranial nerves grossly intact. moves all 4 extremities w/o difficulty. Affect pleasant.  ECG: NSR No ST-T wave abnormalities.    Results for orders placed or performed during the hospital encounter of 03/08/15 (from the past 24 hour(s))  Basic metabolic panel     Status: Abnormal   Collection Time: 03/08/15 10:35 AM  Result Value Ref Range   Sodium 135 135 - 145 mmol/L   Potassium 4.2 3.5 - 5.1 mmol/L   Chloride 102 101 - 111 mmol/L   CO2 25 22 - 32 mmol/L   Glucose, Bld 113 (H) 65 - 99 mg/dL   BUN 14 6 - 20 mg/dL   Creatinine, Ser 2.13 0.61 - 1.24 mg/dL   Calcium 9.4 8.9 - 08.6 mg/dL   GFR calc non Af Amer >60 >60 mL/min   GFR calc Af Amer >60 >60 mL/min   Anion gap 8 5 - 15  CBC     Status: None   Collection Time: 03/08/15 10:35 AM  Result Value Ref Range   WBC 7.2 4.0 - 10.5 K/uL   RBC 4.76 4.22 - 5.81 MIL/uL   Hemoglobin 14.2 13.0 - 17.0 g/dL   HCT 57.8 46.9 - 62.9 %   MCV 88.0 78.0 - 100.0 fL   MCH 29.8 26.0 - 34.0 pg   MCHC 33.9 30.0 - 36.0 g/dL   RDW 52.8 41.3 - 24.4 %  Platelets 170 150 - 400 K/uL  Brain natriuretic peptide     Status: None   Collection Time: 03/08/15 10:35 AM  Result Value Ref Range   B Natriuretic Peptide 5.7 0.0 - 100.0 pg/mL  I-stat troponin, ED     Status: None   Collection Time: 03/08/15 10:46 AM  Result Value Ref Range   Troponin i, poc 0.00 0.00 - 0.08 ng/mL   Comment 3           Dg Chest 2 View  03/08/2015   CLINICAL DATA:  LEFT-sided chest pain began 1 day ago. Shortness of breath intermittently. Ex smoker.  EXAM: CHEST  2 VIEW  COMPARISON:  None.  FINDINGS: Hyperinflation suggesting COPD.  No nodule, infiltrate, or edema. Normal cardiomediastinal silhouette. Thoracic atherosclerosis. Degenerative change in the thoracic spine.  IMPRESSION: No active cardiopulmonary disease.COPD.   Electronically Signed   By: Elsie Stain M.D.   On: 03/08/2015 11:23     ASSESSMENT: 1. Unstable angina 2. Morbid obesity 3. OSA noncompliant with CPAP 4. Former tobacco use quit 2009  5. Probable COPD  PLAN/DISCUSSION:  CP very concerning for [place]. Will plan cath. He agrees,. We will to schedule the procedure for this afternoon if possible.  Bensimhon, Daniel,MD 4:33 PM

## 2015-03-08 NOTE — Interval H&P Note (Signed)
History and Physical Interval Note:  03/08/2015 5:59 PM  Cory Nolan  has presented today for surgery, with the diagnosis of cp  The various methods of treatment have been discussed with the patient and family. After consideration of risks, benefits and other options for treatment, the patient has consented to  Procedure(s): Left Heart Cath and Coronary Angiography (N/A) as a surgical intervention .  The patient's history has been reviewed, patient examined, no change in status, stable for surgery.  I have reviewed theCath Lab Visit (complete for each Cath Lab visit)  Clinical Evaluation Leading to the Procedure:   ACS: Yes.    Non-ACS:    Anginal Classification: CCS IV  Anti-ischemic medical therapy: No Therapy  Non-Invasive Test Results: No non-invasive testing performed  Prior CABG: No previous CABG       [name] 03/08/2015 5:59 PM

## 2015-03-08 NOTE — Progress Notes (Signed)
Informed by pt's primary nurse that he had a call from cath lab  To get pt ready for cath.  Informed Rosann Auerbach  That pt new admit and no orders seen for cath at this time and gave order for Left heart cath, indication chest pain, by  Dr. Gala Romney.  Nurse made aware and order obtained.  Nellie buck, Charity fundraiser.

## 2015-03-09 ENCOUNTER — Inpatient Hospital Stay (HOSPITAL_COMMUNITY): Payer: Self-pay

## 2015-03-09 ENCOUNTER — Other Ambulatory Visit (HOSPITAL_COMMUNITY): Payer: Self-pay

## 2015-03-09 DIAGNOSIS — I2510 Atherosclerotic heart disease of native coronary artery without angina pectoris: Secondary | ICD-10-CM

## 2015-03-09 DIAGNOSIS — R0600 Dyspnea, unspecified: Secondary | ICD-10-CM

## 2015-03-09 LAB — BASIC METABOLIC PANEL
Anion gap: 7 (ref 5–15)
BUN: 9 mg/dL (ref 6–20)
CALCIUM: 9.2 mg/dL (ref 8.9–10.3)
CO2: 25 mmol/L (ref 22–32)
Chloride: 103 mmol/L (ref 101–111)
Creatinine, Ser: 0.71 mg/dL (ref 0.61–1.24)
GFR calc Af Amer: >60 mL/min (ref >60)
GLUCOSE: 163 mg/dL — AB (ref 65–99)
Potassium: 4.5 mmol/L (ref 3.5–5.1)
Sodium: 135 mmol/L (ref 135–145)

## 2015-03-09 LAB — CBC
HCT: 40.8 % (ref 39.0–52.0)
Hemoglobin: 13.8 g/dL (ref 13.0–17.0)
MCH: 29.6 pg (ref 26.0–34.0)
MCHC: 33.8 g/dL (ref 30.0–36.0)
MCV: 87.6 fL (ref 78.0–100.0)
Platelets: 192 10*3/uL (ref 150–400)
RBC: 4.66 MIL/uL (ref 4.22–5.81)
RDW: 13.8 % (ref 11.5–15.5)
WBC: 9.2 10*3/uL (ref 4.0–10.5)

## 2015-03-09 LAB — LIPID PANEL
CHOLESTEROL: 207 mg/dL — AB (ref 0–200)
HDL: 32 mg/dL — ABNORMAL LOW (ref >40)
LDL CALC: 159 mg/dL — AB (ref 0–99)
TRIGLYCERIDES: 82 mg/dL (ref <150)
Total CHOL/HDL Ratio: 6.5 RATIO
VLDL: 16 mg/dL (ref 0–40)

## 2015-03-09 LAB — HEMOGLOBIN A1C
HEMOGLOBIN A1C: 6.2 % — AB (ref 4.8–5.6)
Mean Plasma Glucose: 131 mg/dL

## 2015-03-09 LAB — TROPONIN I

## 2015-03-09 MED ORDER — INFLUENZA VAC SPLIT QUAD 0.5 ML IM SUSY
0.5000 mL | PREFILLED_SYRINGE | Freq: Once | INTRAMUSCULAR | Status: AC
  Administered 2015-03-09: 0.5 mL via INTRAMUSCULAR
  Filled 2015-03-09: qty 0.5

## 2015-03-09 MED ORDER — ASPIRIN EC 81 MG PO TBEC: 81.0000 mg | DELAYED_RELEASE_TABLET | Freq: Every day | ORAL | Status: DC

## 2015-03-09 MED ORDER — ATORVASTATIN CALCIUM 80 MG PO TABS: 80.0000 mg | ORAL_TABLET | Freq: Every day | ORAL | Status: DC

## 2015-03-09 MED ORDER — PNEUMOCOCCAL VAC POLYVALENT 25 MCG/0.5ML IJ INJ
0.5000 mL | INJECTION | Freq: Once | INTRAMUSCULAR | Status: AC
  Administered 2015-03-09: 0.5 mL via INTRAMUSCULAR
  Filled 2015-03-09: qty 0.5

## 2015-03-09 MED ORDER — INFLUENZA VAC SPLIT QUAD 0.5 ML IM SUSY: 0.5000 mL | PREFILLED_SYRINGE | INTRAMUSCULAR | Status: DC

## 2015-03-09 MED ORDER — TICAGRELOR 90 MG PO TABS: 90.0000 mg | ORAL_TABLET | Freq: Two times a day (BID) | ORAL | Status: DC

## 2015-03-09 MED ORDER — PNEUMOCOCCAL VAC POLYVALENT 25 MCG/0.5ML IJ INJ: 0.5000 mL | INJECTION | INTRAMUSCULAR | Status: DC

## 2015-03-09 NOTE — Discharge Summary (Signed)
Physician Discharge Summary  Cory Nolan:[id number] DOB: 12/12/1955 DOA: 03/08/2015  PCP: No primary care provider on file.  Admit date: 03/08/2015 Discharge date: 03/09/2015  Time spent: 40 minutes  Recommendations for Outpatient Follow-up:  1. Follow up with cardiology within 1 week, office will call you. 2. Placed on dual antiplatelets therapy aspirin and Brilinta.  Discharge Diagnoses:  Active Problems:   Dyspnea   SOB (shortness of breath)   Unstable angina   CAD (coronary artery disease), native coronary artery   Discharge Condition: Stable  Diet recommendation: Heart healthy  Filed Weights   03/08/15 1018 03/08/15 1526  Weight: 156.037 kg (344 lb) 155.584 kg (343 lb)    History of present illness:  Cory Nolan is a 59 y.o. male with h/o depression has been on wellbutrin in sthe past, and not anymore, alcohol use, comes inf or 2 weeks of sob on exertion and chest discomfort, pressure since yesterday with left arm numbness since last night. He denies any other complaints. When i saw and examined the patient, he reports 4/10 chest discomfort. He was referred to medical service for admission. Cardiology consulted for recommendations  Hospital Course:   Unstable angina Patient presented to the hospital with DOE and chest discomfort with left arm numbness. Symptoms or angina equivalent, cardiology consulted. Cardiac catheterization was done on 03/08/2015 and showed 90% stenosis in the mid RCA and 99% stenosis in the proximal RCA. 2 DES stents placed in the proximal and distal RCA. Dual antiplatelets therapy with aspirin improvement. Patient with cardiology as outpatient.  Hyperlipidemia Total cholesterol is 207 and LDL is 159. Patient placed on 80 mg of Lipitor, follow with cardiology as outpatient.  Risk modifications Patient has obesity, hyperlipidemia and sedentary lifestyle. Counseled about weight loss, placed on statins and to increase  exercise.  History of tobacco abuse Patient reportedly quit smoking about 8 weeks ago.  Procedures:  Cardiac cath done on 03/08/2015 by Dr. Swaz[place] Conclusion     Prox LAD lesion, 40% stenosed.  The left ventricular systolic function is normal.  Mid RCA to Dist RCA lesion, 90% stenosed. There is a 0% residual stenosis post intervention.  A drug-eluting stent was placed.  Prox RCA lesion, 99% stenosed. There is a 0% residual stenosis post intervention.  A drug-eluting stent was placed.  1. Severe single vessel obstructive CAD 2. Normal LV function 3. Successful stenting of the proximal and distal RCA with DES.  Plan: DAPT for one year. Aggressive risk factor modification.   Consultations:  Cardiology  Discharge Exam: Filed Vitals:   03/09/15 0900  BP: 135/68  Pulse:   Temp:   Resp: 13   General: Alert and awake, oriented x3, not in any acute distress. HEENT: anicteric sclera, pupils reactive to light and accommodation, EOMI CVS: S1-S2 clear, no murmur rubs or gallops Chest: clear to auscultation bilaterally, no wheezing, rales or rhonchi Abdomen: soft nontender, nondistended, normal bowel sounds, no organomegaly Extremities: no cyanosis, clubbing or edema noted bilaterally Neuro: Cranial nerves II-XII intact, no focal neurological deficits  Discharge Instructions   Discharge Instructions    Diet - low sodium heart healthy    Complete by:  As directed      Increase activity slowly    Complete by:  As directed           Current Discharge Medication List    START taking these medications   Details  atorvastatin (LIPITOR) 80 MG tablet Take 1 tablet (80 mg total) by mouth daily  at 6 PM. Qty: 30 tablet, Refills: 0    ticagrelor (BRILINTA) 90 MG TABS tablet Take 1 tablet (90 mg total) by mouth 2 (two) times daily. Qty: 60 tablet, Refills: 0      CONTINUE these medications which have CHANGED   Details  aspirin EC 81 MG tablet Take 1 tablet (81 mg total)  by mouth daily.      CONTINUE these medications which have NOT CHANGED   Details  Glucosamine HCl (GLUCOSAMINE PO) Take 1 capsule by mouth daily.    hydrocortisone cream 1 % Apply 1 application topically daily as needed for itching (eczema on feet).    naproxen sodium (ALEVE) 220 MG tablet Take 440 mg by mouth daily as needed (pain).    nicotine polacrilex (COMMIT) 4 MG lozenge Take 4 mg by mouth every 4 (four) hours as needed (nervousness).    Powders (ANTI MONKEY BUTT EX) Apply 1 application topically daily as needed (friction burns on upper legs).    PRESCRIPTION MEDICATION Place 3-4 drops into the left eye 5 (five) times daily as needed (dry eyes). Lubricating eye drop from [facility] (samples)    ranitidine (ZANTAC) 150 MG tablet Take 150-300 mg by mouth daily as needed for heartburn (also take with naproxen).        No Known Allergies Follow-up Information    Follow up with Peter Swaziland, MD.   Specialty:  Cardiology   Why:  The office will call.   Contact information:   [address] [place] [address] [phone]        The results of significant diagnostics from this hospitalization (including imaging, microbiology, ancillary and laboratory) are listed below for reference.    Significant Diagnostic Studies: Dg Chest 2 View  03/08/2015   CLINICAL DATA:  LEFT-sided chest pain began 1 day ago. Shortness of breath intermittently. Ex smoker.  EXAM: CHEST  2 VIEW  COMPARISON:  None.  FINDINGS: Hyperinflation suggesting COPD. No nodule, infiltrate, or edema. Normal cardiomediastinal silhouette. Thoracic atherosclerosis. Degenerative change in the thoracic spine.  IMPRESSION: No active cardiopulmonary disease.COPD.   Electronically Signed   By: Elsie Stain M.D.   On: 03/08/2015 11:23    Microbiology: Recent Results (from the past 240 hour(s))  MRSA PCR Screening     Status: None   Collection Time: 03/08/15  7:49 PM  Result Value Ref Range Status    MRSA by PCR NEGATIVE NEGATIVE Final    Comment:        The GeneXpert MRSA Assay (FDA approved for NASAL specimens only), is one component of a comprehensive MRSA colonization surveillance program. It is not intended to diagnose MRSA infection nor to guide or monitor treatment for MRSA infections.      Labs: Basic Metabolic Panel:  Recent Labs Lab 03/08/15 1035 03/09/15 0515  NA 135 135  K 4.2 4.5  CL 102 103  CO2 25 25  GLUCOSE 113* 163*  BUN 14 9  CREATININE 0.86 0.71  CALCIUM 9.4 9.2   Liver Function Tests:  Recent Labs Lab 03/08/15 1646  AST 28  ALT 45  ALKPHOS 74  BILITOT 1.3*  PROT 7.2  ALBUMIN 3.9   No results for input(s): LIPASE, AMYLASE in the last 168 hours. No results for input(s): AMMONIA in the last 168 hours. CBC:  Recent Labs Lab 03/08/15 1035 03/09/15 0515  WBC 7.2 9.2  HGB 14.2 13.8  HCT 41.9 40.8  MCV 88.0 87.6  PLT 170 192  Cardiac Enzymes:  Recent Labs Lab 03/08/15 1646 03/08/15 2012 03/09/15 0515  TROPONINI <0.03 <0.03 <0.03   BNP: BNP (last 3 results)  Recent Labs  03/08/15 1035  BNP 5.7    ProBNP (last 3 results) No results for input(s): PROBNP in the last 8760 hours.  CBG: No results for input(s): GLUCAP in the last 168 hours.     Signed:  ELMAHI,MUTAZ A  [facility] 03/09/2015, 11:06 AM

## 2015-03-09 NOTE — Progress Notes (Signed)
       Patient Name: Cory Nolan Date of Encounter: 03/09/2015    SUBJECTIVE: Feels well this morning. No dyspnea chest discomfort.  TELEMETRY:  Normal sinus rhythm: Filed Vitals:   03/09/15 0600 03/09/15 0700 03/09/15 0800 03/09/15 0900  BP: 125/74 102/58 103/62 135/68  Pulse:      Temp:   97.6 F (36.4 C)   TempSrc:   Oral   Resp: Height:      Weight:      SpO2:   91%     Intake/Output Summary (Last 24 hours) at 03/09/15 0955 Last data filed at 03/09/15 0900  Gross per 24 hour  Intake 849.67 ml  Output   1750 ml  Net -900.33 ml   LABS: Basic Metabolic Panel:  Recent Labs  16/10/96 1035 03/09/15 0515  NA 135 135  K 4.2 4.5  CL 102 103  CO2 25 25  GLUCOSE 113* 163*  BUN 14 9  CREATININE 0.86 0.71  CALCIUM 9.4 9.2   CBC:  Recent Labs  03/08/15 1035 03/09/15 0515  WBC 7.2 9.2  HGB 14.2 13.8  HCT 41.9 40.8  MCV 88.0 87.6  PLT 170 192   Cardiac Enzymes:  Recent Labs  03/08/15 1646 03/08/15 2012 03/09/15 0515  TROPONINI <0.03 <0.03 <0.03   BNP: Invalid input(s): POCBNP Hemoglobin A1C:  Recent Labs  03/08/15 1646  HGBA1C 6.2*   Fasting Lipid Panel:  Recent Labs  03/09/15 0515  CHOL 207*  HDL 32*  LDLCALC 159*  TRIG 82  CHOLHDL 6.5    Radiology/Studies:  Unremarkable  Physical Exam: Blood pressure 135/68, pulse 65, temperature 97.6 F (36.4 C), temperature source Oral, resp. rate 13, height  (1.905 m), weight 155.584 kg (343 lb), SpO2 91 %. Weight change:   Wt Readings from Last 3 Encounters:  03/08/15 155.584 kg (343 lb)   Morbidly obese Chest is clear Cardiac exam is unremarkable Radial cath site is unremarkable  ASSESSMENT:  1. Acute coronary syndrome/unstable angina with successful PCI of RCA receiving proximal and distal stents (DE). 2. Morbid obesity 3. Blood pressure is stable 4. Blood sugar is elevated as is A1c of 6.2, under circumstances we need to treat as diabetes mellitus. 5.  Hyperlipidemia  Plan:  1. Needs follow-up with cardiology in 7-10 days post stent 2. Aggressive risk factor modification: Control of diabetes, high-dose statin therapy, weight loss, aerobic activity, phase II cardiac rehabilitation. 3. After phase I cardiac rehabilitation today, would be eligible for discharge assuming no complications. 4. Case manager consult to ensure Brilinta therapy. 5. Blood pressure will not tolerate beta blocker or ACE inhibitor. Selinda Eon 03/09/2015, 9:55 AM

## 2015-03-09 NOTE — Progress Notes (Signed)
DC orders received.  Patient stable with no S/S of distress.  Medication and discharge information reviewed with patient and patient's sisters.  Patient instructed on cath site care as well as follow up information given by case management for medication assistance.     Pt informed RN that PCP prescribed Lipitor in past and he could not tolerate due to severe muscle cramps and that he will not be taking it.  RN educated patient on importance of high-dose statin therapy post stent placement.  Attending MD notified. Stem, Mitzi Hansen

## 2015-03-09 NOTE — Discharge Instructions (Signed)

## 2015-03-09 NOTE — Progress Notes (Signed)
Echocardiogram 2D Echocardiogram has been performed.  Cory Nolan 03/09/2015, 2:35 PM

## 2015-03-09 NOTE — Progress Notes (Signed)
CARDIAC REHAB PHASE I   PRE:  Rate/Rhythm: 77 SR  BP:  Supine: 119/61  Sitting:   Standing:    SaO2:   MODE:  Ambulation: 700 ft   POST:  Rate/Rhythm: 105 ST  BP:  Supine:   Sitting: 120/75  Standing:    SaO2:  1010-1118 Pt walked 700 ft with steady gait. No CP. Education completed with pt who voiced understanding. Stressed importance of brilinta with stent. RN to give booklet. Discussed need for weight loss and gave heart healthy diet and encouraged ex.. Referring to [place] Phase 2 program. Discussed tobacco cessation as pt uses snuff. Pt  stated it was more mouth gratification so gave pt fake cigarette. Also gave tobacco cessation handout.    Luetta Nutting, RN BSN  03/09/2015 11:15 AM

## 2015-03-09 NOTE — Care Management Note (Signed)
Case Management Note  Patient Details  Name: Cory Nolan MRN: [id number] Date of Birth: Apr 22, 1956  Subjective/Objective:                   SOB (shortness of breath) Action/Plan: CM met with pt in room and gave pt Brilinta free 30 day trial card, coupon for Lipitor, and [place] pamphlet.  Pt verbalized understanding he will GO to the [facility] clinic any weekday M-F of this week and ask for AN APPOINTMENT TO SECURE A PRIMARY CARE PHYSICIAN; AN APPOINTMENT WITH A NAVIGATOR TO SECURE INSURANCE; AN APPOINTMENT FOR FOLLOW UP MEDICAL CARE. No other Cm needs were communicated.  Expected Discharge Date:                  Expected Discharge Plan:  Home/Self Care  In-House Referral:     Discharge planning Services  Medication Assistance, [facility]  Post Acute Care Choice:    Choice offered to:  Patient  DME Arranged:    DME Agency:     HH Arranged:    [place] Agency:     Status of Service:  Completed, signed off  Medicare Important Message Given:    Date Medicare IM Given:    Medicare IM give by:    Date Additional Medicare IM Given:    Additional Medicare Important Message give by:     If discussed at [place] of Stay Meetings, dates discussed:    Additional Comments:  Dellie Catholic, RN 03/09/2015, 12:52 PM

## 2015-03-11 ENCOUNTER — Telehealth: Payer: Self-pay | Admitting: Physician Assistant

## 2015-03-11 ENCOUNTER — Encounter (HOSPITAL_COMMUNITY): Payer: Self-pay | Admitting: Cardiology

## 2015-03-11 LAB — POCT ACTIVATED CLOTTING TIME: ACTIVATED CLOTTING TIME: 546 s

## 2015-03-11 NOTE — Telephone Encounter (Signed)
TOC call to patient.He stated he was doing good except he has flu symptoms from flu vaccine received in hospital.He is achy,sore throat.Advised he can take tylenol.He also states he has took lipitor in the past and was unable to take due to pain in legs.Stated he is taking all medication as prescribed.Has noticed lower legs are starting to get sore.Advised he can stop lipitor if has leg pain.Stated he will try and keep taking until post hospital appointment.Advised to keep post hospital appointment with Theodore Demark PA 03/18/15 at 9:30 am at [facility] office.

## 2015-03-11 NOTE — Telephone Encounter (Signed)
TCM phone call .Marland Kitchen appt on 09/26 at 9:30am .. Thanks

## 2015-03-12 ENCOUNTER — Ambulatory Visit: Payer: Self-pay | Attending: Internal Medicine

## 2015-03-13 ENCOUNTER — Inpatient Hospital Stay (HOSPITAL_COMMUNITY): Payer: Self-pay

## 2015-03-13 ENCOUNTER — Encounter (HOSPITAL_COMMUNITY): Payer: Self-pay | Admitting: Family Medicine

## 2015-03-13 ENCOUNTER — Inpatient Hospital Stay (HOSPITAL_COMMUNITY)
Admission: EM | Admit: 2015-03-13 | Discharge: 2015-03-22 | DRG: 176 | Disposition: A | Discharge status: Home Health | Payer: Self-pay | Attending: Cardiology | Admitting: Cardiology

## 2015-03-13 ENCOUNTER — Emergency Department (HOSPITAL_COMMUNITY): Payer: Self-pay

## 2015-03-13 ENCOUNTER — Telehealth: Payer: Self-pay | Admitting: Cardiology

## 2015-03-13 DIAGNOSIS — R072 Precordial pain: Secondary | ICD-10-CM

## 2015-03-13 DIAGNOSIS — Z87891 Personal history of nicotine dependence: Secondary | ICD-10-CM

## 2015-03-13 DIAGNOSIS — K760 Fatty (change of) liver, not elsewhere classified: Secondary | ICD-10-CM | POA: Clinically undetermined

## 2015-03-13 DIAGNOSIS — Z955 Presence of coronary angioplasty implant and graft: Secondary | ICD-10-CM

## 2015-03-13 DIAGNOSIS — I10 Essential (primary) hypertension: Secondary | ICD-10-CM | POA: Diagnosis present

## 2015-03-13 DIAGNOSIS — Z9861 Coronary angioplasty status: Secondary | ICD-10-CM

## 2015-03-13 DIAGNOSIS — I25118 Atherosclerotic heart disease of native coronary artery with other forms of angina pectoris: Secondary | ICD-10-CM

## 2015-03-13 DIAGNOSIS — R0600 Dyspnea, unspecified: Secondary | ICD-10-CM

## 2015-03-13 DIAGNOSIS — R0602 Shortness of breath: Secondary | ICD-10-CM

## 2015-03-13 DIAGNOSIS — I723 Aneurysm of iliac artery: Secondary | ICD-10-CM | POA: Diagnosis present

## 2015-03-13 DIAGNOSIS — I82441 Acute embolism and thrombosis of right tibial vein: Secondary | ICD-10-CM | POA: Diagnosis present

## 2015-03-13 DIAGNOSIS — Z789 Other specified health status: Secondary | ICD-10-CM

## 2015-03-13 DIAGNOSIS — R000 Tachycardia, unspecified: Secondary | ICD-10-CM | POA: Diagnosis present

## 2015-03-13 DIAGNOSIS — K7581 Nonalcoholic steatohepatitis (NASH): Secondary | ICD-10-CM | POA: Diagnosis present

## 2015-03-13 DIAGNOSIS — R7303 Prediabetes: Secondary | ICD-10-CM | POA: Diagnosis present

## 2015-03-13 DIAGNOSIS — I2699 Other pulmonary embolism without acute cor pulmonale: Secondary | ICD-10-CM

## 2015-03-13 DIAGNOSIS — E669 Obesity, unspecified: Secondary | ICD-10-CM

## 2015-03-13 DIAGNOSIS — E118 Type 2 diabetes mellitus with unspecified complications: Secondary | ICD-10-CM | POA: Diagnosis present

## 2015-03-13 DIAGNOSIS — R079 Chest pain, unspecified: Secondary | ICD-10-CM | POA: Insufficient documentation

## 2015-03-13 DIAGNOSIS — Z86711 Personal history of pulmonary embolism: Secondary | ICD-10-CM | POA: Diagnosis present

## 2015-03-13 DIAGNOSIS — I2510 Atherosclerotic heart disease of native coronary artery without angina pectoris: Secondary | ICD-10-CM | POA: Diagnosis present

## 2015-03-13 DIAGNOSIS — Z7982 Long term (current) use of aspirin: Secondary | ICD-10-CM

## 2015-03-13 DIAGNOSIS — R58 Hemorrhage, not elsewhere classified: Secondary | ICD-10-CM

## 2015-03-13 DIAGNOSIS — E785 Hyperlipidemia, unspecified: Secondary | ICD-10-CM | POA: Diagnosis present

## 2015-03-13 DIAGNOSIS — I272 Other secondary pulmonary hypertension: Secondary | ICD-10-CM | POA: Diagnosis present

## 2015-03-13 HISTORY — DX: Morbid (severe) obesity due to excess calories: E66.01

## 2015-03-13 HISTORY — DX: Other pulmonary embolism without acute cor pulmonale: I26.99

## 2015-03-13 HISTORY — DX: Atherosclerotic heart disease of native coronary artery without angina pectoris: I25.10

## 2015-03-13 LAB — BASIC METABOLIC PANEL
Anion gap: 10 (ref 5–15)
BUN: 14 mg/dL (ref 6–20)
CHLORIDE: 100 mmol/L — AB (ref 101–111)
CO2: 24 mmol/L (ref 22–32)
Calcium: 9.6 mg/dL (ref 8.9–10.3)
Creatinine, Ser: 0.94 mg/dL (ref 0.61–1.24)
GFR calc Af Amer: >60 mL/min (ref >60)
GFR calc non Af Amer: >60 mL/min (ref >60)
GLUCOSE: 153 mg/dL — AB (ref 65–99)
POTASSIUM: 4.3 mmol/L (ref 3.5–5.1)
Sodium: 134 mmol/L — ABNORMAL LOW (ref 135–145)

## 2015-03-13 LAB — CBC
HEMATOCRIT: 44.7 % (ref 39.0–52.0)
Hemoglobin: 15.1 g/dL (ref 13.0–17.0)
MCH: 29.6 pg (ref 26.0–34.0)
MCHC: 33.8 g/dL (ref 30.0–36.0)
MCV: 87.6 fL (ref 78.0–100.0)
Platelets: 204 10*3/uL (ref 150–400)
RBC: 5.1 MIL/uL (ref 4.22–5.81)
RDW: 13.6 % (ref 11.5–15.5)
WBC: 10 10*3/uL (ref 4.0–10.5)

## 2015-03-13 LAB — D-DIMER, QUANTITATIVE: D-Dimer, Quant: 6.34 ug/mL-FEU — ABNORMAL HIGH (ref 0.00–0.48)

## 2015-03-13 LAB — I-STAT TROPONIN, ED: Troponin i, poc: 0 ng/mL (ref 0.00–0.08)

## 2015-03-13 LAB — TROPONIN I

## 2015-03-13 LAB — BRAIN NATRIURETIC PEPTIDE: B Natriuretic Peptide: 22.2 pg/mL (ref 0.0–100.0)

## 2015-03-13 MED ORDER — ATORVASTATIN CALCIUM 80 MG PO TABS: 80.0000 mg | ORAL_TABLET | Freq: Every day | ORAL | Status: DC

## 2015-03-13 MED ORDER — METOPROLOL TARTRATE 12.5 MG HALF TABLET
12.5000 mg | ORAL_TABLET | Freq: Two times a day (BID) | ORAL | Status: DC
  Administered 2015-03-14 – 2015-03-22 (×18): 12.5 mg via ORAL
  Filled 2015-03-13 (×18): qty 1

## 2015-03-13 MED ORDER — ONDANSETRON HCL 4 MG/2ML IJ SOLN: 4.0000 mg | Freq: Four times a day (QID) | INTRAMUSCULAR | Status: DC | PRN

## 2015-03-13 MED ORDER — HEPARIN (PORCINE) IN NACL 100-0.45 UNIT/ML-% IJ SOLN
2200.0000 [IU]/h | INTRAMUSCULAR | Status: DC
  Administered 2015-03-13 – 2015-03-22 (×16): 2000 [IU]/h via INTRAVENOUS
  Filled 2015-03-13 (×18): qty 250

## 2015-03-13 MED ORDER — HEPARIN BOLUS VIA INFUSION
6000.0000 [IU] | Freq: Once | INTRAVENOUS | Status: AC
  Administered 2015-03-13: 6000 [IU] via INTRAVENOUS
  Filled 2015-03-13: qty 6000

## 2015-03-13 MED ORDER — ASPIRIN EC 81 MG PO TBEC
81.0000 mg | DELAYED_RELEASE_TABLET | Freq: Every day | ORAL | Status: DC
  Administered 2015-03-13 – 2015-03-22 (×10): 81 mg via ORAL
  Filled 2015-03-13 (×10): qty 1

## 2015-03-13 MED ORDER — NITROGLYCERIN 0.4 MG SL SUBL: 0.4000 mg | SUBLINGUAL_TABLET | SUBLINGUAL | Status: DC | PRN

## 2015-03-13 MED ORDER — IOHEXOL 350 MG/ML SOLN
100.0000 mL | Freq: Once | INTRAVENOUS | Status: AC | PRN
  Administered 2015-03-13: 100 mL via INTRAVENOUS

## 2015-03-13 MED ORDER — ACETAMINOPHEN 325 MG PO TABS
650.0000 mg | ORAL_TABLET | ORAL | Status: DC | PRN
  Administered 2015-03-15 – 2015-03-19 (×5): 650 mg via ORAL
  Filled 2015-03-13 (×5): qty 2

## 2015-03-13 MED ORDER — TICAGRELOR 90 MG PO TABS
90.0000 mg | ORAL_TABLET | Freq: Two times a day (BID) | ORAL | Status: DC
  Administered 2015-03-13 – 2015-03-22 (×18): 90 mg via ORAL
  Filled 2015-03-13 (×19): qty 1

## 2015-03-13 MED ORDER — ATORVASTATIN CALCIUM 80 MG PO TABS
80.0000 mg | ORAL_TABLET | Freq: Every day | ORAL | Status: DC
  Administered 2015-03-13 – 2015-03-17 (×5): 80 mg via ORAL
  Filled 2015-03-13 (×5): qty 1

## 2015-03-13 NOTE — ED Notes (Signed)
Pt placed into a recliner due to back pain. Pt remains connected to ECG monitor, BP cuff, and pulse ox.

## 2015-03-13 NOTE — ED Notes (Signed)
Patient transported to CT 

## 2015-03-13 NOTE — Progress Notes (Signed)
ANTICOAGULATION CONSULT NOTE - Initial Consult  Pharmacy Consult for Heparin Indication: pulmonary embolus  No Known Allergies  Patient Measurements: TBW 155.6 kg IBW 84.5 kg Heparin Dosing Weight: 120 kg  Vital Signs: Temp: 98.1 F (36.7 C) (09/21 1333) BP: 110/70 mmHg (09/21 2015) Pulse Rate: 92 (09/21 2015)  Labs:  Recent Labs  03/13/15 1335 03/13/15 1824  HGB 15.1  --   HCT 44.7  --   PLT 204  --   CREATININE 0.94  --   TROPONINI  --  <0.03    Estimated Creatinine Clearance: 133.5 mL/min (by C-G formula based on Cr of 0.94).   Medical History: Past Medical History  Diagnosis Date  . Coronary artery disease 03/08/15  . Morbid obesity   . Dyslipidemia   . Diabetes mellitus type 2 in obese     Assessment: 59 yo M presents on 9/21 with chest pain. CT of chest shows multiple bilateral PEs including a saddle embolus. Pharmacy consulted to start heparin gtt. CBC stable. No s/s of bleed.  Goal of Therapy:  Heparin level 0.3-0.7 units/ml Monitor platelets by anticoagulation protocol: Yes   Plan:  Give 6,000 unit heparin BLUS Start heparin gtt at 2,000 units/hr Check 6 hr HL Monitor daily HL, CBC, s/s of bleed   Roan Miklos J 03/13/2015,9:54 PM

## 2015-03-13 NOTE — ED Provider Notes (Signed)
CSN: [id number]     Arrival date & time 03/13/15  1317 History   First MD Initiated Contact with Patient 03/13/15 1351     Chief Complaint  Patient presents with  . Shortness of Breath     (Consider location/radiation/quality/duration/timing/severity/associated sxs/prior Treatment) Patient is a 59 y.o. male presenting with shortness of breath. The history is provided by the patient.  Shortness of Breath Severity:  Moderate Associated symptoms: chest pain   Associated symptoms: no abdominal pain, no headaches, no rash and no vomiting   patient with shortness of breath. He had a recent heart catheterization with stent 5 days ago. That was last week. He states after discharge his shortness of breath had improved but it began to recur again a couple days later. States he had some pressure but not as severe as he had before he needed the stent. States he had been able to walk at [facility] but has not been able to do as much walking as he had before. No fevers or chills. No cough. No abdominal pain. States he does have some swelling in his legs. Slight chest pressure now.  Past Medical History  Diagnosis Date  . Coronary artery disease 03/08/15  . Morbid obesity   . Dyslipidemia   . Diabetes mellitus type 2 in obese    Past Surgical History  Procedure Laterality Date  . Knee surgery    . Cardiac catheterization N/A 03/08/2015    Procedure: Left Heart Cath and Coronary Angiography;  Surgeon: Peter M Swaziland, MD;  Location: [facility] INVASIVE CV LAB;  Service: Cardiovascular;  Laterality: N/A;   History reviewed. No pertinent family history. Social History  Substance Use Topics  . Smoking status: Former Smoker    Quit date: 01/09/2015  . Smokeless tobacco: Current User    Types: Chew  . Alcohol Use: Yes     Comment: occ    Review of Systems  Constitutional: Negative for activity change and appetite change.  Eyes: Negative for pain.  Respiratory: Positive for shortness of breath. Negative for  chest tightness.   Cardiovascular: Positive for chest pain and leg swelling.  Gastrointestinal: Negative for nausea, vomiting, abdominal pain and diarrhea.  Genitourinary: Negative for flank pain.  Musculoskeletal: Negative for back pain and neck stiffness.  Skin: Negative for rash.  Neurological: Negative for weakness, numbness and headaches.  Psychiatric/Behavioral: Negative for behavioral problems.      Allergies  Review of patient's allergies indicates no known allergies.  Home Medications   Prior to Admission medications   Medication Sig Start Date End Date Taking? Authorizing Kaoir Loree  aspirin EC 81 MG tablet Take 1 tablet (81 mg total) by mouth daily. 03/09/15   Clydia Llano, MD  atorvastatin (LIPITOR) 80 MG tablet Take 1 tablet (80 mg total) by mouth daily at 6 PM. 03/09/15   Clydia Llano, MD  Glucosamine HCl (GLUCOSAMINE PO) Take 1 capsule by mouth daily.    Historical Rommel Hogston, MD  hydrocortisone cream 1 % Apply 1 application topically daily as needed for itching (eczema on feet).    Historical Venola Castello, MD  naproxen sodium (ALEVE) 220 MG tablet Take 440 mg by mouth daily as needed (pain).    Historical Leyla Soliz, MD  nicotine polacrilex (COMMIT) 4 MG lozenge Take 4 mg by mouth every 4 (four) hours as needed (nervousness).    Historical Malayiah Mcbrayer, MD  Powders (ANTI MONKEY BUTT EX) Apply 1 application topically daily as needed (friction burns on upper legs).    Historical Reiana Poteet, MD  PRESCRIPTION MEDICATION Place 3-4 drops into the left eye 5 (five) times daily as needed (dry eyes). Lubricating eye drop from [facility] (samples)    Historical Evalin Shawhan, MD  ranitidine (ZANTAC) 150 MG tablet Take 150-300 mg by mouth daily as needed for heartburn (also take with naproxen).     Historical Linell Meldrum, MD  ticagrelor (BRILINTA) 90 MG TABS tablet Take 1 tablet (90 mg total) by mouth 2 (two) times daily. 03/09/15   Clydia Llano, MD   BP 113/63 mmHg  Pulse 96  Temp(Src) 98.1 F (36.7  C)  Resp 16  SpO2 95% Physical Exam  Constitutional: He is oriented to person, place, and time. He appears well-developed and well-nourished.  Patient is obese  HENT:  Head: Normocephalic and atraumatic.  Eyes: EOM are normal. Pupils are equal, round, and reactive to light.  Neck: Normal range of motion. Neck supple.  Cardiovascular: Normal rate, regular rhythm and normal heart sounds.   No murmur heard. Pulmonary/Chest: Effort normal and breath sounds normal.  Abdominal: Soft. Bowel sounds are normal. He exhibits no distension. There is no tenderness.  Musculoskeletal: Normal range of motion. He exhibits edema.  Mild edema to bilateral lower legs.  Neurological: He is alert and oriented to person, place, and time. No cranial nerve deficit.  Skin: Skin is warm and dry.  Nursing note and vitals reviewed.   ED Course  Procedures (including critical care time) Labs Review Labs Reviewed  BASIC METABOLIC PANEL - Abnormal; Notable for the following:    Sodium 134 (*)    Chloride 100 (*)    Glucose, Bld 153 (*)    All other components within normal limits  CBC  BRAIN NATRIURETIC PEPTIDE  I-STAT TROPOININ, ED    Imaging Review Dg Chest 2 View  03/13/2015   CLINICAL DATA:  Chest pain, difficulty breathing.  EXAM: CHEST  2 VIEW  COMPARISON:  March 08, 2015.  FINDINGS: The heart size and mediastinal contours are within normal limits. Both lungs are clear. No pneumothorax or pleural effusion is noted. Anterior osteophyte formation is noted in the lower thoracic spine.  IMPRESSION: No active cardiopulmonary disease.   Electronically Signed   By: Lupita Raider, M.D.   On: 03/13/2015 13:54   I have personally reviewed and evaluated these images and lab results as part of my medical decision-making.   EKG Interpretation   Date/Time:  Wednesday March 13 2015 13:23:06 EDT Ventricular Rate:  110 PR Interval:  160 QRS Duration: 86 QT Interval:  326 QTC Calculation: 441 R  Axis:   -40 Text Interpretation:  Sinus tachycardia Left axis deviation Abnormal ECG  Confirmed by Rubin Payor  MD, Harrold Donath [phone]) on 03/13/2015 1:52:13 PM      MDM   Final diagnoses:  None    Patient with dyspnea. Has had this as an anginal equivalent in the past. EKG stable except for mild tachycardia. X-ray reassuring. Cardiology was aware the patient and will come to see him.    Benjiman Core, MD 03/13/15 [phone]

## 2015-03-13 NOTE — Progress Notes (Signed)
Echocardiogram 2D Echocardiogram has been performed.  Cory Nolan 03/13/2015, 11:31 PM

## 2015-03-13 NOTE — Progress Notes (Signed)
Called by Radiology to report that chest CT angio confirmed bilateral submassive PEs with RV strain.  Called Code PE and discussed case with Elink MD.  Patient does not meet criteria for lytics at this time.  He is hemodynamically stable and not tachycardic.  His troponin is normal.  Will consult pharmacy for IV Heparin gtt.  CHeck 2D echo to assess RV

## 2015-03-13 NOTE — ED Notes (Signed)
Pt here for SOB worsening over the weekend. sts more with exertion. Pt recent cath and stent placement. sts also recently received the flu shot and has been having chills and back pain.

## 2015-03-13 NOTE — H&P (Signed)
Cardiologist: [place] Praneeth T Daywalt is an 59 y.o. male.   Chief Complaint: Chest pain HPI:  Patient is a 59 year old male with history of coronary disease. He just had a left heart catheterization 03/08/2015 revealing a proximal LAD lesion of 40%, mid RCA to distal RCA lesion 90% which was stented with a drug-eluting stent. He was also proximal RCA lesion of 99% which was also stented. He had normal LV function.  He was discharged on aspirin, Brilinta, high-dose statin.  Blood pressure was not adequate enough to start beta blocker or Ace.    Resents with complaints of shortness of breath, fatigue, lower back pain and chest pressure. The chest pressure at its worst was about 2 out of 10. Appears to be some diaphoresis associated with it. This all started on Monday after feeling achy and sore. He did get a flu shot when he was hospitalized last week. [facility] and one of the trainers thought he looked ashen in appearance. He was very short of breath with chest pressure at that time.  Currently has 2 out of 10 chest pressure.  No obvious swelling in his lower extremities. The patient currently denies nausea, vomiting, fever,orthopnea, dizziness, PND, cough, congestion, abdominal pain, hematochezia, melena, lower extremity edema, claudication.    Medications: Medication Sig  aspirin EC 81 MG tablet Take 1 tablet (81 mg total) by mouth daily.  atorvastatin (LIPITOR) 80 MG tablet Take 1 tablet (80 mg total) by mouth daily at 6 PM.  Glucosamine HCl (GLUCOSAMINE PO) Take 1 capsule by mouth daily.  hydrocortisone cream 1 % Apply 1 application topically daily as needed for itching (eczema on feet).  naproxen sodium (ALEVE) 220 MG tablet Take 440 mg by mouth daily as needed (pain).  nicotine polacrilex (COMMIT) 4 MG lozenge Take 4 mg by mouth every 4 (four) hours as needed (nervousness).  Powders (ANTI MONKEY BUTT EX) Apply 1 application topically daily as needed  (friction burns on upper legs).  PRESCRIPTION MEDICATION Place 3-4 drops into the left eye 5 (five) times daily as needed (dry eyes). Lubricating eye drop from [facility] (samples)  ranitidine (ZANTAC) 150 MG tablet Take 150-300 mg by mouth daily as needed for heartburn (also take with naproxen).   ticagrelor (BRILINTA) 90 MG TABS tablet Take 1 tablet (90 mg total) by mouth 2 (two) times daily.     Past Medical History  Diagnosis Date  . Coronary artery disease 03/08/15  . Morbid obesity   . Dyslipidemia   . Diabetes mellitus type 2 in obese     Past Surgical History  Procedure Laterality Date  . Knee surgery    . Cardiac catheterization N/A 03/08/2015    Procedure: Left Heart Cath and Coronary Angiography;  Surgeon: Peter M Martinique, MD;  Location: [place] CV LAB;  Service: Cardiovascular;  Laterality: N/A;    History reviewed. No pertinent family history. Social History:  reports that he quit smoking about 2 months ago. His smokeless tobacco use includes Chew. He reports that he drinks alcohol. His drug history is not on file.  Allergies: No Known Allergies   (Not in a hospital admission)  Results for orders placed or performed during the hospital encounter of 03/13/15 (from the past 48 hour(s))  Basic metabolic panel     Status: Abnormal   Collection Time: 03/13/15  1:35 PM  Result Value Ref Range   Sodium 134 (L) 135 - 145 mmol/L  Potassium 4.3 3.5 - 5.1 mmol/L   Chloride 100 (L) 101 - 111 mmol/L   CO2 24 22 - 32 mmol/L   Glucose, Bld 153 (H) 65 - 99 mg/dL   BUN 14 6 - 20 mg/dL   Creatinine, Ser 0.94 0.61 - 1.24 mg/dL   Calcium 9.6 8.9 - 10.3 mg/dL   GFR calc non Af Amer >60 >60 mL/min   GFR calc Af Amer >60 >60 mL/min    Comment: (NOTE) The eGFR has been calculated using the CKD EPI equation. This calculation has not been validated in all clinical situations. eGFR's persistently <60 mL/min signify possible Chronic Kidney Disease.    Anion gap 10 5 - 15    CBC     Status: None   Collection Time: 03/13/15  1:35 PM  Result Value Ref Range   WBC 10.0 4.0 - 10.5 K/uL   RBC 5.10 4.22 - 5.81 MIL/uL   Hemoglobin 15.1 13.0 - 17.0 g/dL   HCT 44.7 39.0 - 52.0 %   MCV 87.6 78.0 - 100.0 fL   MCH 29.6 26.0 - 34.0 pg   MCHC 33.8 30.0 - 36.0 g/dL   RDW 13.6 11.5 - 15.5 %   Platelets 204 150 - 400 K/uL  Brain natriuretic peptide     Status: None   Collection Time: 03/13/15  1:35 PM  Result Value Ref Range   B Natriuretic Peptide 22.2 0.0 - 100.0 pg/mL  I-stat troponin, ED     Status: None   Collection Time: 03/13/15  2:18 PM  Result Value Ref Range   Troponin i, poc 0.00 0.00 - 0.08 ng/mL   Comment 3            Comment: Due to the release kinetics of cTnI, a negative result within the first hours of the onset of symptoms does not rule out myocardial infarction with certainty. If myocardial infarction is still suspected, repeat the test at appropriate intervals.    Lipid Panel     Component Value Date/Time   CHOL 207* 03/09/2015 0515   TRIG 82 03/09/2015 0515   HDL 32* 03/09/2015 0515   CHOLHDL 6.5 03/09/2015 0515   VLDL 16 03/09/2015 0515   LDLCALC 159* 03/09/2015 0515      Dg Chest 2 View  03/13/2015   CLINICAL DATA:  Chest pain, difficulty breathing.  EXAM: CHEST  2 VIEW  COMPARISON:  March 08, 2015.  FINDINGS: The heart size and mediastinal contours are within normal limits. Both lungs are clear. No pneumothorax or pleural effusion is noted. Anterior osteophyte formation is noted in the lower thoracic spine.  IMPRESSION: No active cardiopulmonary disease.   Electronically Signed   By: Marijo Conception, M.D.   On: 03/13/2015 13:54    Review of Systems  Constitutional: Positive for malaise/fatigue and diaphoresis. Negative for fever.  HENT: Negative for congestion and sore throat.   Respiratory: Positive for shortness of breath. Negative for cough.   Cardiovascular: Positive for chest pain. Negative for orthopnea, leg swelling  and PND.  Gastrointestinal: Negative for nausea, vomiting, abdominal pain, blood in stool and melena.  Musculoskeletal: Positive for back pain (lumbar).  Neurological: Positive for weakness. Negative for dizziness.  All other systems reviewed and are negative.   Blood pressure 113/63, pulse 96, temperature 98.1 F (36.7 C), resp. rate 16, SpO2 95 %. Physical Exam  Nursing note and vitals reviewed. Constitutional: He is oriented to person, place, and time. He appears well-developed. No distress.  Obese  HENT:  Head: Normocephalic and atraumatic.  Mouth/Throat: No oropharyngeal exudate.  Eyes: No scleral icterus.  Right eye: Pupil are reactive to light accommodation.  Extraocular movement intact. 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 No JVD present.  Cardiovascular: Normal rate, regular rhythm, S1 normal and S2 normal.   No murmur heard. Pulses:      Radial pulses are 2+ on the right side, and 2+ on the left side.       Dorsalis pedis pulses are 2+ on the right side, and 2+ on the left side.  Respiratory: Effort normal and breath sounds normal. He has no wheezes. He has no rales.  GI: Soft. Bowel sounds are normal. He exhibits no distension. There is no tenderness.  Musculoskeletal: He exhibits no edema.  Lymphadenopathy:    He has no cervical adenopathy.  Neurological: He is alert and oriented to person, place, and time. He exhibits normal muscle tone.  Skin: Skin is warm and dry.  Psychiatric: He has a normal mood and affect.     Assessment/Plan Principal Problem:   Chest pain Active Problems:   SOB (shortness of breath)   CAD (coronary artery disease), native coronary artery   Obesity  Patient will be admitted overnight for observation. Continue to cycle troponin. Monitor on telemetry. Try adding a low-dose beta blocker. Continue statin. EKG shows no acute changes and chest x-ray shows nothing acute. Symptoms seem excessive for  Brilinta side effect.  Reaction to flu shot? Would not explain chest pressure however.  Tachycardia and shortness of breath could be related to PE.  Check D-Dimer.  Symptoms do not improve, consider rechecking coronary arteries.  Tarri Fuller, [facility] 03/13/2015, 3:32 PM   Patient seen and examined. Agree with assessment and plan.  Mr. KNOLAN SIMIEN is a 60 year old male with a history of morbid obesity, who had developed significant exertional dyspnea and episodes of chest tightness leading to his cardiac catheterization 1 week ago.  He was found to have high-grade proximal and mid RCA lesions which were successfully stented with Promus DES stents.  He had concomitant CAD with 40% LAD stenosis.  LV function was normal.  Will plan to review the angiographic images He was discharged on aspirin and Brilinta for dual antiplatelets therapy and high-dose atorvastatin, but was not started on concomitant medical therapy with beta blocker, nitrates, or ACE inhibitor due to low blood pressure and pulse rate.  The patient also has had issues with back discomfort.  He had a Pneumovax and flu shot prior to discharge.  Yesterday he developed significant shortness of breath when attempting to walk at the [facility].  Today he also has noticed a sensation of chest pressure and increased heart rate.  He denies sharp pain but at times notes a dull pressure.  He presented for reevaluation in the emergency room.  Presently, he still has mild residual discomfort.  Initial troponin is normal.  Initial BMP is normal.  ECG does not show acute change.  He is mildly tachycardic with a resting pulse approximately 100.  Will check a d-dimer.  Plan to initiate very low-dose beta blocker therapy as blood pressure allows.  The patient will be admitted overnight for at least 24-hour observation with serial troponins and ECGs being obtained.  If he continues to experience recurrent symptomatology.  Definitive repeat cardiac catheterization may be  necessary tomorrow.  Otherwise, medical therapy will be added to his regimen.  The patient has  a history highly suggestive of significant obstructive sleep apnea.  Should pursue an outpatient sleep study in a split-night protocol.   Troy Sine, MD, [facility] 03/13/2015 3:40 PM

## 2015-03-13 NOTE — Telephone Encounter (Signed)
Returned call to patient he stated he is sob,unable to walk from room to room without being sob.Stated he feels like someone pushing on his chest.Stated he is having same symptoms he had before he had stents.Also having severe pain across lower back with frequent urination.Spoke to DOD Dr.Berry he advised he needs to go to [facility] ER.Trish called.

## 2015-03-14 ENCOUNTER — Encounter (HOSPITAL_COMMUNITY): Payer: Self-pay

## 2015-03-14 DIAGNOSIS — I25110 Atherosclerotic heart disease of native coronary artery with unstable angina pectoris: Secondary | ICD-10-CM

## 2015-03-14 DIAGNOSIS — I2699 Other pulmonary embolism without acute cor pulmonale: Principal | ICD-10-CM

## 2015-03-14 LAB — CBC
HEMATOCRIT: 42.1 % (ref 39.0–52.0)
HEMOGLOBIN: 14 g/dL (ref 13.0–17.0)
MCH: 29.5 pg (ref 26.0–34.0)
MCHC: 33.3 g/dL (ref 30.0–36.0)
MCV: 88.8 fL (ref 78.0–100.0)
Platelets: 181 10*3/uL (ref 150–400)
RBC: 4.74 MIL/uL (ref 4.22–5.81)
RDW: 13.7 % (ref 11.5–15.5)
WBC: 8.8 10*3/uL (ref 4.0–10.5)

## 2015-03-14 LAB — C DIFFICILE QUICK SCREEN W PCR REFLEX
C DIFFICILE (CDIFF) INTERP: NEGATIVE
C DIFFICILE (CDIFF) TOXIN: NEGATIVE
C DIFFICLE (CDIFF) ANTIGEN: NEGATIVE

## 2015-03-14 LAB — PROTIME-INR
INR: 1.25 (ref 0.00–1.49)
Prothrombin Time: 15.9 seconds — ABNORMAL HIGH (ref 11.6–15.2)

## 2015-03-14 LAB — HEPARIN LEVEL (UNFRACTIONATED)
Heparin Unfractionated: 0.53 IU/mL (ref 0.30–0.70)
Heparin Unfractionated: 0.47 IU/mL (ref 0.30–0.70)

## 2015-03-14 LAB — TROPONIN I

## 2015-03-14 LAB — MRSA PCR SCREENING: MRSA BY PCR: NEGATIVE

## 2015-03-14 MED ORDER — WARFARIN VIDEO
Freq: Once | Status: AC
  Administered 2015-03-14: 17:00:00

## 2015-03-14 MED ORDER — WARFARIN SODIUM 7.5 MG PO TABS
7.5000 mg | ORAL_TABLET | Freq: Once | ORAL | Status: AC
  Administered 2015-03-14: 7.5 mg via ORAL
  Filled 2015-03-14: qty 1

## 2015-03-14 MED ORDER — COUMADIN BOOK
Freq: Once | Status: AC
  Administered 2015-03-14: 17:00:00
  Filled 2015-03-14: qty 1

## 2015-03-14 MED ORDER — LOPERAMIDE HCL 2 MG PO CAPS
4.0000 mg | ORAL_CAPSULE | Freq: Once | ORAL | Status: AC
  Administered 2015-03-15: 4 mg via ORAL
  Filled 2015-03-14: qty 2

## 2015-03-14 MED ORDER — LOPERAMIDE HCL 2 MG PO CAPS: 2.0000 mg | ORAL_CAPSULE | ORAL | Status: DC | PRN

## 2015-03-14 MED ORDER — WARFARIN - PHARMACIST DOSING INPATIENT
Freq: Every day | Status: DC
  Administered 2015-03-15 – 2015-03-19 (×2)

## 2015-03-14 NOTE — Progress Notes (Signed)
ANTICOAGULATION CONSULT NOTE - Follow Up Consult  Pharmacy Consult for heparin and warfarin Indication: pulmonary embolus  No Known Allergies  Patient Measurements: Height:  (190.5 cm) Weight: (!) 330 lb 1.6 oz (149.732 kg) IBW/kg (Calculated) : 84.5 Heparin Dosing Weight: 120 kg  Vital Signs: Temp: 98.2 F (36.8 C) (09/22 1113) Temp Source: Oral (09/22 1113) BP: 123/57 mmHg (09/22 1113) Pulse Rate: 73 (09/22 1200)  Labs:  Recent Labs  03/13/15 1335 03/13/15 1824 03/14/15 0416 03/14/15 1006  HGB 15.1  --  14.0  --   HCT 44.7  --  42.1  --   PLT 204  --  181  --   HEPARINUNFRC  --   --  0.53 0.47  CREATININE 0.94  --   --   --   TROPONINI  --  <0.03 <0.03  --     Estimated Creatinine Clearance: 130.7 mL/min (by C-G formula based on Cr of 0.94).   Assessment: 59yoM admitted 03/13/2015 with SOB, fatigue, lower back pain and chest pressure. CT of chest showed bilateral submassive PEs with RV strain.   PMH: CAD (DESx2 placed 03/08/15), HLD, DM2  HL 0.47 (therapeutic), H/H stable, Plt stable, Baseline INR 1.25. No s/sx of bleeding noted.  Goal of Therapy:  INR 2-3 Heparin level 0.3-0.7 units/ml Monitor platelets by anticoagulation protocol: Yes   Plan:  - Continue heparin 2000 units/hr - Will give warfarin 7.5mg  x1 tonight - Monitor daily HL, INR, CBC, s/sx of bleeding  Belinda Fisher Stone 03/14/2015,12:40 PM

## 2015-03-14 NOTE — Progress Notes (Signed)
Utilization review completed. Bertha Stanfill, RN, BSN. 

## 2015-03-14 NOTE — Progress Notes (Signed)
Alert is becoming hostile and cursing out nursing staff and other patients are complaining of being bothered by pt's tone of voice and choice of language.  Explained to patient that he does not need to curse and yell.  Pt yelled, "I will just leave then!!!"  Notified charge nurse of patients behavior.  Paged Dr. Royann Shivers at this time who states that he is coming to see the patient.

## 2015-03-14 NOTE — Progress Notes (Signed)
Patient Name: Cory Nolan Date of Encounter: 03/14/2015  Principal Problem:   Chest pain Active Problems:   Dyspnea   SOB (shortness of breath)   CAD (coronary artery disease), native coronary artery   Obesity   Length of Stay: 1  SUBJECTIVE  A little short of breath when he moves, no chest pain. Profuse watery diarrhea x 7 times just started overnight. No recent antibiotics. Angry about being NPO.  CURRENT MEDS . aspirin EC  81 mg Oral Daily  . atorvastatin  80 mg Oral q1800  . metoprolol tartrate  12.5 mg Oral BID  . ticagrelor  90 mg Oral BID    OBJECTIVE   Intake/Output Summary (Last 24 hours) at 03/14/15 1037 Last data filed at 03/14/15 0700  Gross per 24 hour  Intake 177.33 ml  Output      0 ml  Net 177.33 ml   Filed Weights   03/13/15 2345  Weight: 330 lb 1.6 oz (149.732 kg)    PHYSICAL EXAM Filed Vitals:   03/14/15 0418 03/14/15 0500 03/14/15 0728 03/14/15 0850  BP:  121/74 105/48 132/63  Pulse:  70 71 80  Temp: 97.8 F (36.6 C)  98 F (36.7 C)   TempSrc: Oral  Oral   Resp:  19 21   Height:      Weight:      SpO2:  94% 95%    General: Alert, oriented x3, no distress, morbid obesity Head: no evidence of trauma, PERRL, EOMI, no exophtalmos or lid lag, no myxedema, no xanthelasma; normal ears, nose and oropharynx Neck: normal jugular venous pulsations and no hepatojugular reflux; brisk carotid pulses without delay and no carotid bruits Chest: faint bilateral wheezes, no signs of consolidation by percussion or palpation, normal fremitus, symmetrical and full respiratory excursions Cardiovascular: unable to identify the apical impulse, regular rhythm, distant first and second heart sounds, no rubs or gallops, no murmur Abdomen: no tenderness or distention, no masses by palpation, no abnormal pulsatility or arterial bruits, normal bowel sounds, no hepatosplenomegaly Extremities: no clubbing, cyanosis or edema; 2+ radial, ulnar and brachial pulses  bilaterally; 2+ right femoral, posterior tibial and dorsalis pedis pulses; 2+ left femoral, posterior tibial and dorsalis pedis pulses; no subclavian or femoral bruits. Healed right radial artery access Neurological: grossly nonfocal  LABS  CBC  Recent Labs  03/13/15 1335 03/14/15 0416  WBC 10.0 8.8  HGB 15.1 14.0  HCT 44.7 42.1  MCV 87.6 88.8  PLT 204 181   Basic Metabolic Panel  Recent Labs  03/13/15 1335  NA 134*  K 4.3  CL 100*  CO2 24  GLUCOSE 153*  BUN 14  CREATININE 0.94  CALCIUM 9.6   Liver Function Tests No results for input(s): AST, ALT, ALKPHOS, BILITOT, PROT, ALBUMIN in the last 72 hours. No results for input(s): LIPASE, AMYLASE in the last 72 hours. Cardiac Enzymes  Recent Labs  03/13/15 1824 03/14/15 0416  TROPONINI <0.03 <0.03   BNP Invalid input(s): POCBNP D-Dimer  Recent Labs  03/13/15 1824  DDIMER 6.34*   Hemoglobin A1C No results for input(s): HGBA1C in the last 72 hours. Fasting Lipid Panel No results for input(s): CHOL, HDL, LDLCALC, TRIG, CHOLHDL, LDLDIRECT in the last 72 hours. Thyroid Function Tests No results for input(s): TSH, T4TOTAL, T3FREE, THYROIDAB in the last 72 hours.  Invalid input(s): FREET3  Radiology Studies Imaging results have been reviewed and Dg Chest 2 View  03/13/2015   CLINICAL DATA:  Chest pain, difficulty breathing.  EXAM:  CHEST  2 VIEW  COMPARISON:  March 08, 2015.  FINDINGS: The heart size and mediastinal contours are within normal limits. Both lungs are clear. No pneumothorax or pleural effusion is noted. Anterior osteophyte formation is noted in the lower thoracic spine.  IMPRESSION: No active cardiopulmonary disease.   Electronically Signed   By: Lupita Raider, M.D.   On: 03/13/2015 13:54   Ct Angio Chest Pe W/cm &/or Wo Cm  03/13/2015   CLINICAL DATA:  Severe shortness of breath, elevated D-dimer  EXAM: CT ANGIOGRAPHY CHEST WITH CONTRAST  TECHNIQUE: Multidetector CT imaging of the chest was  performed using the standard protocol during bolus administration of intravenous contrast. Multiplanar CT image reconstructions and MIPs were obtained to evaluate the vascular anatomy.  CONTRAST:  OMNIPAQUE IOHEXOL 350 MG/ML SOLN IV  COMPARISON:  None  FINDINGS: Aorta normal caliber without aneurysm or dissection.  Scattered atherosclerotic calcifications aorta, proximal great vessels and coronary arteries.  Pulmonary arteries well opacified.  Multiple filling defects identified within pulmonary arteries bilaterally compatible with extensive pulmonary embolism.  This includes emboli within both upper lobes, RIGHT middle lobe and BILATERAL lower lobes.  Saddle emboli identified at bifurcations of RIGHT pulmonary artery and descending interlobar pulmonary artery.  Elevated RV/LV ratio = 1.11.  Questionable wall thickening of the distal esophagus.  Visualized upper abdomen unremarkable.  No thoracic adenopathy.  11 x 6 mm diameter area of nodular thickening along minor fissure image 49.  Remaining lungs clear.  No pleural effusion or pneumothorax.  Review of the MIP images confirms the above findings.  IMPRESSION: Multiple BILATERAL pulmonary emboli including a saddle embolus at the bifurcation of the RIGHT pulmonary artery.  Positive for acute PE with CT evidence of right heart strain (RV/LV Ratio = 1.11) consistent with at least submassive (intermediate risk) PE. The presence of right heart strain has been associated with an increased risk of morbidity and mortality. Please activate Code PE by paging [phone].  Findings called to Dr. Mayford Knife on 03/13/2015 at 2146 hr.   Electronically Signed   By: Ulyses Southward M.D.   On: 03/13/2015 21:46    TELE NSR  ECG Sinus tachycardia, inc RBBB  ASSESSMENT AND PLAN  1. Acute pulmonary embolism - not after a femoral cath, but not long after hospitalization for MI. Since he takes Brilinta, plan IV heparin to warfarin transition. Check leg Doppler US. Echo  pending  2. CAD s/p recent RCA stent - continue ASA/Brilinta, statin  3. Diarrhea - recent hospital stay, but no antibiotics. Check C.diff  4. Morbid obesity  5. Former smoker - some wheezing today, suspect COPD   Thurmon Fair, MD, [facility] [phone] office [phone] pager 03/14/2015 10:37 AM

## 2015-03-14 NOTE — Progress Notes (Signed)
Received CR order on admit however pt is now positive for PE. Please advise on ambulation. Ethelda Chick CES, ACSM 2:55 PM 03/14/2015

## 2015-03-14 NOTE — Progress Notes (Signed)
ANTICOAGULATION CONSULT NOTE - Follow Up Consult  Pharmacy Consult for heparin Indication: pulmonary embolus  Labs:  Recent Labs  03/13/15 1335 03/13/15 1824 03/14/15 0416  HGB 15.1  --  14.0  HCT 44.7  --  42.1  PLT 204  --  181  HEPARINUNFRC  --   --  0.53  CREATININE 0.94  --   --   TROPONINI  --  <0.03  --      Assessment/Plan:  59yo male therapeutic on heparin with initial dosing for PE. Will continue gtt at current rate and confirm stable with additional level.   Vernard Gambles, PharmD, BCPS  03/14/2015,5:21 AM

## 2015-03-14 NOTE — Progress Notes (Signed)
Notified dietary of pt's diet order placed and states, "will send a small 'early lunch' tray now and a regular lunch tray at lunch time"

## 2015-03-15 ENCOUNTER — Encounter (HOSPITAL_COMMUNITY): Payer: Self-pay | Admitting: Cardiology

## 2015-03-15 ENCOUNTER — Inpatient Hospital Stay (HOSPITAL_COMMUNITY): Payer: Self-pay

## 2015-03-15 DIAGNOSIS — R197 Diarrhea, unspecified: Secondary | ICD-10-CM | POA: Insufficient documentation

## 2015-03-15 DIAGNOSIS — I2699 Other pulmonary embolism without acute cor pulmonale: Secondary | ICD-10-CM

## 2015-03-15 DIAGNOSIS — Z86711 Personal history of pulmonary embolism: Secondary | ICD-10-CM | POA: Diagnosis present

## 2015-03-15 DIAGNOSIS — E119 Type 2 diabetes mellitus without complications: Secondary | ICD-10-CM

## 2015-03-15 DIAGNOSIS — Z9889 Other specified postprocedural states: Secondary | ICD-10-CM

## 2015-03-15 LAB — PROTIME-INR
INR: 1.2 (ref 0.00–1.49)
Prothrombin Time: 15.4 s — ABNORMAL HIGH (ref 11.6–15.2)

## 2015-03-15 LAB — CBC
HCT: 42.2 % (ref 39.0–52.0)
Hemoglobin: 14.1 g/dL (ref 13.0–17.0)
MCH: 29.7 pg (ref 26.0–34.0)
MCHC: 33.4 g/dL (ref 30.0–36.0)
MCV: 88.8 fL (ref 78.0–100.0)
Platelets: 179 K/uL (ref 150–400)
RBC: 4.75 MIL/uL (ref 4.22–5.81)
RDW: 13.7 % (ref 11.5–15.5)
WBC: 9 K/uL (ref 4.0–10.5)

## 2015-03-15 LAB — HEPARIN LEVEL (UNFRACTIONATED): Heparin Unfractionated: 0.44 [IU]/mL (ref 0.30–0.70)

## 2015-03-15 MED ORDER — WARFARIN SODIUM 7.5 MG PO TABS
7.5000 mg | ORAL_TABLET | Freq: Once | ORAL | Status: AC
  Administered 2015-03-15: 7.5 mg via ORAL
  Filled 2015-03-15: qty 1

## 2015-03-15 NOTE — Progress Notes (Addendum)
*  Preliminary Results* Bilateral lower extremity venous duplex completed. The right lower extremity is positive for deep vein thrombosis involving the right posterior tibial and peroneal veins. The left lower extremity is negative for deep vein thrombosis. There is no Baker's cyst on the left, there is evidence of a complex Baker's cyst on the right.  Preliminary results discussed with Alexia Freestone, RN.  03/15/2015 11:37 AM  Gertie Fey, RVT, RDCS, RDMS

## 2015-03-15 NOTE — Progress Notes (Signed)
ANTICOAGULATION CONSULT NOTE - Follow Up Consult  Pharmacy Consult for heparin and warfarin Indication: pulmonary embolus  No Known Allergies  Patient Measurements: Height:  (190.5 cm) Weight: (!) 330 lb 1.6 oz (149.732 kg) IBW/kg (Calculated) : 84.5 Heparin Dosing Weight: 120 kg  Vital Signs: Temp: 97.5 F (36.4 C) (09/23 0758) Temp Source: Oral (09/23 0758) BP: 108/60 mmHg (09/23 0758) Pulse Rate: 76 (09/23 0758)  Labs:  Recent Labs  03/13/15 1335 03/13/15 1824 03/14/15 0416 03/14/15 1006 03/15/15 0246  HGB 15.1  --  14.0  --  14.1  HCT 44.7  --  42.1  --  42.2  PLT 204  --  181  --  179  LABPROT  --   --   --  15.9* 15.4*  INR  --   --   --  1.25 1.20  HEPARINUNFRC  --   --  0.53 0.47 0.44  CREATININE 0.94  --   --   --   --   TROPONINI  --  <0.03 <0.03  --   --     Estimated Creatinine Clearance: 130.7 mL/min (by C-G formula based on Cr of 0.94).    Assessment: 59yoM admitted 03/13/2015 with SOB, fatigue, lower back pain and chest pressure. CT of chest showed bilateral submassive PEs with RV strain.   PMH: CAD (DESx2 placed 03/08/15), HLD, DM2  HL 0.44 (therapeutic), INR 1.20. H/H stable, Plt stable, no s/sx of bleeding noted.  Goal of Therapy:  INR 2-3 Heparin level 0.3-0.7 units/ml Monitor platelets by anticoagulation protocol: Yes   Plan:  - Continue heparin 2000 units/hr  - Will give warfarin 7.5mg  x1 tonight - Monitor daily HL, INR, CBC and s/sx of bleeding  Casilda Carls, PharmD. Clinical Pharmacist Resident Pager: [phone] 03/15/2015,11:14 AM

## 2015-03-15 NOTE — Progress Notes (Signed)
Subjective: No complaints  Objective: Vital signs in last 24 hours: Temp:  [97.9 F (36.6 C)-98.7 F (37.1 C)] 98.7 F (37.1 C) (09/23 0417) Pulse Rate:  [71-126] 71 (09/23 0417) Resp:  [18-24] 18 (09/22 1113) BP: (100-132)/(57-81) 112/65 mmHg (09/23 0417) SpO2:  [91 %-97 %] 96 % (09/23 0417) Weight change:  Last BM Date: 03/15/15 Intake/Output from previous day: 09/22 0701 - 09/23 0700 In: 1440 [P.O.:960; I.V.:480] Out: -  Intake/Output this shift:    PE: General:Pleasant affect, NAD Skin:Warm and dry, brisk capillary refill HEENT:normocephalic, sclera clear, mucus membranes moist Heart:S1S2 RRR without murmur, gallup, rub or click Lungs:clear without rales, rhonchi, occ wheezes ZOX:WRUE, non tender, + BS, do not palpate liver spleen or masses Ext: tr lower ext edema, 2+ pedal pulses, 2+ radial pulses Neuro:alert and oriented X 3, MAE, follows commands, + facial symmetry Tele: SR  Lab Results:  Recent Labs  03/14/15 0416 03/15/15 0246  WBC 8.8 9.0  HGB 14.0 14.1  HCT 42.1 42.2  PLT 181 179   BMET  Recent Labs  03/13/15 1335  NA 134*  K 4.3  CL 100*  CO2 24  GLUCOSE 153*  BUN 14  CREATININE 0.94  CALCIUM 9.6    Recent Labs  03/13/15 1824 03/14/15 0416  TROPONINI <0.03 <0.03    Lab Results  Component Value Date   CHOL 207* 03/09/2015   HDL 32* 03/09/2015   LDLCALC 159* 03/09/2015   TRIG 82 03/09/2015   CHOLHDL 6.5 03/09/2015   Lab Results  Component Value Date   HGBA1C 6.2* 03/08/2015        Studies/Results: Dg Chest 2 View  03/13/2015   CLINICAL DATA:  Chest pain, difficulty breathing.  EXAM: CHEST  2 VIEW  COMPARISON:  March 08, 2015.  FINDINGS: The heart size and mediastinal contours are within normal limits. Both lungs are clear. No pneumothorax or pleural effusion is noted. Anterior osteophyte formation is noted in the lower thoracic spine.  IMPRESSION: No active cardiopulmonary disease.   Electronically Signed    By: Lupita Raider, M.D.   On: 03/13/2015 13:54   Ct Angio Chest Pe W/cm &/or Wo Cm  03/13/2015   CLINICAL DATA:  Severe shortness of breath, elevated D-dimer  EXAM: CT ANGIOGRAPHY CHEST WITH CONTRAST  TECHNIQUE: Multidetector CT imaging of the chest was performed using the standard protocol during bolus administration of intravenous contrast. Multiplanar CT image reconstructions and MIPs were obtained to evaluate the vascular anatomy.  CONTRAST:  OMNIPAQUE IOHEXOL 350 MG/ML SOLN IV  COMPARISON:  None  FINDINGS: Aorta normal caliber without aneurysm or dissection.  Scattered atherosclerotic calcifications aorta, proximal great vessels and coronary arteries.  Pulmonary arteries well opacified.  Multiple filling defects identified within pulmonary arteries bilaterally compatible with extensive pulmonary embolism.  This includes emboli within both upper lobes, RIGHT middle lobe and BILATERAL lower lobes.  Saddle emboli identified at bifurcations of RIGHT pulmonary artery and descending interlobar pulmonary artery.  Elevated RV/LV ratio = 1.11.  Questionable wall thickening of the distal esophagus.  Visualized upper abdomen unremarkable.  No thoracic adenopathy.  11 x 6 mm diameter area of nodular thickening along minor fissure image 49.  Remaining lungs clear.  No pleural effusion or pneumothorax.  Review of the MIP images confirms the above findings.  IMPRESSION: Multiple BILATERAL pulmonary emboli including a saddle embolus at the bifurcation of the RIGHT pulmonary artery.  Positive for acute PE with CT evidence of  right heart strain (RV/LV Ratio = 1.11) consistent with at least submassive (intermediate risk) PE. The presence of right heart strain has been associated with an increased risk of morbidity and mortality. Please activate Code PE by paging [phone].  Findings called to Dr. Mayford Knife on 03/13/2015 at 2146 hr.   Electronically Signed   By: Ulyses Southward M.D.   On: 03/13/2015 21:46   ECHO: Study  Conclusions  - Left ventricle: The cavity size was normal. There was mild concentric hypertrophy. Systolic function was normal. The estimated ejection fraction was in the range of 55% to 60%. Wall motion was normal; there were no regional wall motion abnormalities. - Aortic valve: Trileaflet; normal thickness, mildly calcified leaflets. - Aorta: Aortic root dimension: 40 mm (ED). - Aortic root: The aortic root was mildly dilated. - Right ventricle: Poor acoustical windows reduces accuracy of RV assessment. The RV is upper limits of normal to mildly dilated. Unable to assess RVF accurately due to poor windows. The cavity size was mildly dilated. Wall thickness was normal. - Pulmonary arteries: PA peak pressure: 48 mm Hg (S).  Impressions:  - The right ventricular systolic pressure was increased consistent with moderate pulmonary hypertension  Cardiac cath: Conclusion     Prox LAD lesion, 40% stenosed.  The left ventricular systolic function is normal.  Mid RCA to Dist RCA lesion, 90% stenosed. There is a 0% residual stenosis post intervention.  A drug-eluting stent was placed.  Prox RCA lesion, 99% stenosed. There is a 0% residual stenosis post intervention.  A drug-eluting stent was placed.  1. Severe single vessel obstructive CAD 2. Normal LV function 3. Successful stenting of the proximal and distal RCA with DES.  Plan: DAPT for one year. Aggressive risk factor modification.     Medications: I have reviewed the patient's current medications. Scheduled Meds: . aspirin EC  81 mg Oral Daily  . atorvastatin  80 mg Oral q1800  . metoprolol tartrate  12.5 mg Oral BID  . ticagrelor  90 mg Oral BID  . Warfarin - Pharmacist Dosing Inpatient   Does not apply q1800   Continuous Infusions: . heparin 2,000 Units/hr (03/15/15 0700)   PRN Meds:.acetaminophen, loperamide, nitroGLYCERIN, ondansetron (ZOFRAN) IV  Assessment/Plan:  Pt readmitted after  admit for CAD and stent to RCA Principal Problem:   Bilateral pulmonary embolism, multiple 03/13/15- INR  1.20 heparin coumadin crossover  Active Problems:   Chest pain   Dyspnea   SOB (shortness of breath)   CAD (coronary artery disease), native coronary artery   Obesity   S/P angioplasty with stent-RCA prox and distal-DES 03/06/15   Diarrhea  Neg c diff   hgbA1C at 6.2-   Tobacco use- stopped tobacco 9-10 weeks ago.  LOS: 2 days   Time spent with pt. :15 minutes. [facility] R  Nurse Practitioner Certified Pager [phone] or after 5pm and on weekends call [phone] 03/15/2015, 7:57 AM   I have seen and examined the patient along with Nada Boozer, NP.  I have reviewed the chart, notes and new data.  I agree with NP's note.  Key new complaints: has right sided pleuritic chest pain, but no dyspnea Key examination changes: no rubs heard Key new findings / data: LE Doppler pending, INR has not yet budged  PLAN: Keep in hospital until 48h overlap heparin/warfarin with INR 2.0. DC on warfarin, ASA and Brilinta and DC ASA one month after stent. When his current Brilinta bottle runs out, consider switching to clopidogrel and checking P2Y12.  He does not have insurance drug coverage and paid $600 for Brilinta. DVT/PE could be considered "provoked" (hospitalized for coronary event) and may consider warfarin for 6 months rather than 12 months. Newly diagnosed mild diabetes - nutrition consult, no need for meds. Has had warfarin monitoring/drug interaction/diet restriction education.  Thurmon Fair, MD, [facility] [phone] 03/15/2015, 8:32 AM

## 2015-03-15 NOTE — Plan of Care (Signed)
Problem: Food- and Nutrition-Related Knowledge Deficit (NB-1.1) Goal: Nutrition education Formal process to instruct or train a patient/client in a skill or to impart knowledge to help patients/clients voluntarily manage or modify food choices and eating behavior to maintain or improve health. Outcome: Adequate for Discharge  RD consulted for nutrition education regarding diabetes.     Lab Results  Component Value Date    HGBA1C 6.2* 03/08/2015   Pt reports no hx of blood sugar control issues, however, he expresses concern about being diagnosed with diabetes and obese status. He reveals that his main issue is portion control; he eats very large servings. He reveals that he has made some transitions to a healthier diet, including eating salads and choosing whole grain products. He consumes black coffee and water as beverages. He enjoys the outdoors and had some excellent ideas of becoming more active to facilitate weight loss. He reports having a good support system in his family.   RD provided "Carbohydrate Counting for People with Diabetes" handout from the Academy of [employer]etics. Discussed different food groups and their effects on blood sugar, emphasizing carbohydrate-containing foods. Provided list of carbohydrates and recommended serving sizes of common foods.  Discussed importance of controlled and consistent carbohydrate intake throughout the day. Provided examples of ways to balance meals/snacks and encouraged intake of high-fiber, whole grain complex carbohydrates. Teach back method used.  Expect fair to good compliance compliance.  Body mass index is 41.26 kg/(m^2). Pt meets criteria for extreme obesity, class III based on current BMI.  Current diet order is Carb MOdified, patient is consuming approximately 100% of meals at this time. Labs and medications reviewed. No further nutrition interventions warranted at this time. RD contact information provided. If additional  nutrition issues arise, please re-consult RD.  Cory Nolan, RD, LDN, CDE Pager: [phone] After hours Pager: [phone]

## 2015-03-15 NOTE — Progress Notes (Signed)
CARDIAC REHAB PHASE I   Attempt to ambulate with pt, no mention of activity progression in today's note. Preliminary results of LE duplex reveal DVT. Spoke with pt's RN, states she does not think pt can ambulate at this time. Please clarify activity orders and advise on ambulation. Will follow.     Joylene Grapes, RN, BSN 03/15/2015 2:16 PM

## 2015-03-16 DIAGNOSIS — R071 Chest pain on breathing: Secondary | ICD-10-CM

## 2015-03-16 LAB — PROTIME-INR
INR: 1.16 (ref 0.00–1.49)
PROTHROMBIN TIME: 15 s (ref 11.6–15.2)

## 2015-03-16 LAB — CBC
HEMATOCRIT: 38.7 % — AB (ref 39.0–52.0)
Hemoglobin: 12.8 g/dL — ABNORMAL LOW (ref 13.0–17.0)
MCH: 28.9 pg (ref 26.0–34.0)
MCHC: 33.1 g/dL (ref 30.0–36.0)
MCV: 87.4 fL (ref 78.0–100.0)
PLATELETS: 181 10*3/uL (ref 150–400)
RBC: 4.43 MIL/uL (ref 4.22–5.81)
RDW: 13.6 % (ref 11.5–15.5)
WBC: 6.5 10*3/uL (ref 4.0–10.5)

## 2015-03-16 LAB — HEPARIN LEVEL (UNFRACTIONATED): Heparin Unfractionated: 0.44 IU/mL (ref 0.30–0.70)

## 2015-03-16 MED ORDER — WARFARIN SODIUM 10 MG PO TABS
10.0000 mg | ORAL_TABLET | Freq: Once | ORAL | Status: AC
  Administered 2015-03-16: 10 mg via ORAL
  Filled 2015-03-16: qty 1

## 2015-03-16 MED ORDER — WARFARIN SODIUM 7.5 MG PO TABS: 7.5000 mg | ORAL_TABLET | Freq: Once | ORAL | Status: DC

## 2015-03-16 NOTE — Progress Notes (Addendum)
ANTICOAGULATION CONSULT NOTE - Follow Up Consult  Pharmacy Consult for heparin and warfarin Indication: pulmonary embolus  No Known Allergies  Patient Measurements: Height:  (190.5 cm) Weight: (!) 330 lb 1.6 oz (149.732 kg) IBW/kg (Calculated) : 84.5 Heparin Dosing Weight: 120 kg  Vital Signs: Temp: 97.6 F (36.4 C) (09/24 1249) Temp Source: Oral (09/24 1249) BP: 134/79 mmHg (09/24 1200) Pulse Rate: 77 (09/24 1200)  Labs:  Recent Labs  03/13/15 1335 03/13/15 1824  03/14/15 0416 03/14/15 1006 03/15/15 0246 03/16/15 0245  HGB 15.1  --   --  14.0  --  14.1 12.8*  HCT 44.7  --   --  42.1  --  42.2 38.7*  PLT 204  --   --  181  --  179 181  LABPROT  --   --   --   --  15.9* 15.4* 15.0  INR  --   --   --   --  1.25 1.20 1.16  HEPARINUNFRC  --   --   < > 0.53 0.47 0.44 0.44  CREATININE 0.94  --   --   --   --   --   --   TROPONINI  --  <0.03  --  <0.03  --   --   --   < > = values in this interval not displayed.  Estimated Creatinine Clearance: 130.7 mL/min (by C-G formula based on Cr of 0.94).   Assessment: 59yoM admitted 03/13/2015 with SOB, fatigue, lower back pain and chest pressure. CT of chest showed bilateral submassive PEs with RV strain.   PMH: CAD (DESx2 placed 03/08/15), HLD, DM2  Heparin level 0.44 (therapeutic) on 2000 units/hr, INR 1.16 on coumadin, started 9/22.  H/H 14.1>>12.8, Pltc 181K stable, no s/sx of bleeding noted. Also on ASA and Brilinta. 59y.o male , 42ft 3in, 149.7 kg   Goal of Therapy:  INR 2-3 Heparin level 0.3-0.7 units/ml Monitor platelets by anticoagulation protocol: Yes   Plan:  - Continue heparin 2000 units/hr  - Will give warfarin 10 mg x1 tonight - Monitor daily HL, INR, CBC and s/sx of bleeding   Noah Delaine, RPh Clinical Pharmacist Pager: [phone] 03/16/2015,1:03 PM

## 2015-03-16 NOTE — Progress Notes (Signed)
Patient ID: CABELL LAZENBY, male   DOB: 1955-12-25, 59 y.o.   MRN: [id number]    Patient Name: Cory Nolan Date of Encounter: 03/16/2015     Principal Problem:   Bilateral pulmonary embolism, multiple 03/13/15 Active Problems:   Dyspnea   SOB (shortness of breath)   CAD (coronary artery disease), native coronary artery   Obesity   Chest pain   S/P angioplasty with stent-RCA prox and distal-DES 03/06/15   Diarrhea    SUBJECTIVE  Minimal chest pain yesterday, dyspnea resolved.   CURRENT MEDS . aspirin EC  81 mg Oral Daily  . atorvastatin  80 mg Oral q1800  . metoprolol tartrate  12.5 mg Oral BID  . ticagrelor  90 mg Oral BID  . Warfarin - Pharmacist Dosing Inpatient   Does not apply q1800    OBJECTIVE  Filed Vitals:   03/16/15 0000 03/16/15 0001 03/16/15 0400 03/16/15 0800  BP:      Pulse: 63     Temp:  97.6 F (36.4 C) 97.5 F (36.4 C) 97.9 F (36.6 C)  TempSrc:  Oral Oral Oral  Resp:      Height:      Weight:      SpO2: 99%       Intake/Output Summary (Last 24 hours) at 03/16/15 0844 Last data filed at 03/16/15 0838  Gross per 24 hour  Intake   2000 ml  Output      0 ml  Net   2000 ml   Filed Weights   03/13/15 2345  Weight: 330 lb 1.6 oz (149.732 kg)    PHYSICAL EXAM  General: Pleasant, obese, NAD. Neuro: Alert and oriented X 3. Moves all extremities spontaneously. Psych: Normal affect. HEENT:  Normal  Neck: Supple without bruits, 7 cm JVD. Lungs:  Resp regular and unlabored, CTA. Heart: RRR no s3, s4, or murmurs. Abdomen: Soft, non-tender, non-distended, BS + x 4.  Extremities: No clubbing, cyanosis or edema. DP/PT/Radials 2+ and equal bilaterally.  Accessory Clinical Findings  CBC  Recent Labs  03/15/15 0246 03/16/15 0245  WBC 9.0 6.5  HGB 14.1 12.8*  HCT 42.2 38.7*  MCV 88.8 87.4  PLT 179 181   Basic Metabolic Panel  Recent Labs  03/13/15 1335  NA 134*  K 4.3  CL 100*  CO2 24  GLUCOSE 153*  BUN 14  CREATININE 0.94    CALCIUM 9.6   Liver Function Tests No results for input(s): AST, ALT, ALKPHOS, BILITOT, PROT, ALBUMIN in the last 72 hours. No results for input(s): LIPASE, AMYLASE in the last 72 hours. Cardiac Enzymes  Recent Labs  03/13/15 1824 03/14/15 0416  TROPONINI <0.03 <0.03   BNP Invalid input(s): POCBNP D-Dimer  Recent Labs  03/13/15 1824  DDIMER 6.34*   Hemoglobin A1C No results for input(s): HGBA1C in the last 72 hours. Fasting Lipid Panel No results for input(s): CHOL, HDL, LDLCALC, TRIG, CHOLHDL, LDLDIRECT in the last 72 hours. Thyroid Function Tests No results for input(s): TSH, T4TOTAL, T3FREE, THYROIDAB in the last 72 hours.  Invalid input(s): FREET3  TELE  nsr  Radiology/Studies  Dg Chest 2 View  03/13/2015   CLINICAL DATA:  Chest pain, difficulty breathing.  EXAM: CHEST  2 VIEW  COMPARISON:  March 08, 2015.  FINDINGS: The heart size and mediastinal contours are within normal limits. Both lungs are clear. No pneumothorax or pleural effusion is noted. Anterior osteophyte formation is noted in the lower thoracic spine.  IMPRESSION: No active cardiopulmonary disease.  Electronically Signed   By: Lupita Raider, M.D.   On: 03/13/2015 13:54   Dg Chest 2 View  03/08/2015   CLINICAL DATA:  LEFT-sided chest pain began 1 day ago. Shortness of breath intermittently. Ex smoker.  EXAM: CHEST  2 VIEW  COMPARISON:  None.  FINDINGS: Hyperinflation suggesting COPD. No nodule, infiltrate, or edema. Normal cardiomediastinal silhouette. Thoracic atherosclerosis. Degenerative change in the thoracic spine.  IMPRESSION: No active cardiopulmonary disease.COPD.   Electronically Signed   By: Elsie Stain M.D.   On: 03/08/2015 11:23   Ct Angio Chest Pe W/cm &/or Wo Cm  03/13/2015   CLINICAL DATA:  Severe shortness of breath, elevated D-dimer  EXAM: CT ANGIOGRAPHY CHEST WITH CONTRAST  TECHNIQUE: Multidetector CT imaging of the chest was performed using the standard protocol during bolus  administration of intravenous contrast. Multiplanar CT image reconstructions and MIPs were obtained to evaluate the vascular anatomy.  CONTRAST:  OMNIPAQUE IOHEXOL 350 MG/ML SOLN IV  COMPARISON:  None  FINDINGS: Aorta normal caliber without aneurysm or dissection.  Scattered atherosclerotic calcifications aorta, proximal great vessels and coronary arteries.  Pulmonary arteries well opacified.  Multiple filling defects identified within pulmonary arteries bilaterally compatible with extensive pulmonary embolism.  This includes emboli within both upper lobes, RIGHT middle lobe and BILATERAL lower lobes.  Saddle emboli identified at bifurcations of RIGHT pulmonary artery and descending interlobar pulmonary artery.  Elevated RV/LV ratio = 1.11.  Questionable wall thickening of the distal esophagus.  Visualized upper abdomen unremarkable.  No thoracic adenopathy.  11 x 6 mm diameter area of nodular thickening along minor fissure image 49.  Remaining lungs clear.  No pleural effusion or pneumothorax.  Review of the MIP images confirms the above findings.  IMPRESSION: Multiple BILATERAL pulmonary emboli including a saddle embolus at the bifurcation of the RIGHT pulmonary artery.  Positive for acute PE with CT evidence of right heart strain (RV/LV Ratio = 1.11) consistent with at least submassive (intermediate risk) PE. The presence of right heart strain has been associated with an increased risk of morbidity and mortality. Please activate Code PE by paging [phone].  Findings called to Dr. Mayford Knife on 03/13/2015 at 2146 hr.   Electronically Signed   By: Ulyses Southward M.D.   On: 03/13/2015 21:46    ASSESSMENT AND PLAN  1. Acute bilateral PE - continue IV heparin and coumadin, consider uptitration of coumadin, he remains hemodynamically stable. 2. Recent coronary stenting - continue current meds. 3. Obesity 4. HTN  Gregg Taylor,M.D.  03/16/2015 8:44 AM

## 2015-03-16 NOTE — Progress Notes (Signed)
CARDIAC REHAB PHASE I   PRE:  Rate/Rhythm: 60 SR  BP:  Sitting: 96/48        SaO2: 97 RA  MODE:  Ambulation: 300 ft   POST:  Rate/Rhythm: 64 SR  BP:  Sitting: 105/69         SaO2: 97 RA  Ok to ambulate today per Dr. Ladona Ridgel. Pt eager to walk. BP a little low. Pt ambulated 300 ft on RA, IV, standby assist, steady gait, tolerated fair. Pt c/o L groin pain upon standing and with initial activity, improved with ambulation. Pt c/o moderate DOE, sats 95-97% on RA. Pt denies CP, dizziness, declined rest stop. Pt to recliner after walk, call bell within reach. Spoke with RN and pt, encouraged ambulation as tolerated. Pt verbalized understanding. Will follow-up Monday.  [id number]  Joylene Grapes, RN, BSN 03/16/2015 1:10 PM

## 2015-03-17 LAB — CBC
HCT: 38.3 % — ABNORMAL LOW (ref 39.0–52.0)
Hemoglobin: 13 g/dL (ref 13.0–17.0)
MCH: 30 pg (ref 26.0–34.0)
MCHC: 33.9 g/dL (ref 30.0–36.0)
MCV: 88.2 fL (ref 78.0–100.0)
PLATELETS: 175 10*3/uL (ref 150–400)
RBC: 4.34 MIL/uL (ref 4.22–5.81)
RDW: 13.6 % (ref 11.5–15.5)
WBC: 6.5 10*3/uL (ref 4.0–10.5)

## 2015-03-17 LAB — HEPARIN LEVEL (UNFRACTIONATED): Heparin Unfractionated: 0.49 IU/mL (ref 0.30–0.70)

## 2015-03-17 LAB — PROTIME-INR
INR: 1.42 (ref 0.00–1.49)
PROTHROMBIN TIME: 17.5 s — AB (ref 11.6–15.2)

## 2015-03-17 MED ORDER — WARFARIN SODIUM 7.5 MG PO TABS
7.5000 mg | ORAL_TABLET | Freq: Once | ORAL | Status: AC
  Administered 2015-03-17: 7.5 mg via ORAL
  Filled 2015-03-17: qty 1

## 2015-03-17 MED ORDER — COUMADIN BOOK
Freq: Once | Status: AC
  Administered 2015-03-17: 08:00:00
  Filled 2015-03-17: qty 1

## 2015-03-17 NOTE — Progress Notes (Signed)
Mr Rugg is complaining of severe groin pain with ambulation.  Pain described as sharp stabbing pain to fold between the thigh and scrotum that appeared after ambulating yesterday.  Pain is absent until he attempts to lift leg off bed or bear weight on it.  Treated with tylenol but patient would like physician to follow up with this complaint on the morning. No history of similar episodes.  Instructed to remain in bed until MD follows up in the morning due to fall risk and anticoagulation.

## 2015-03-17 NOTE — Progress Notes (Signed)
Patient ID: KOLSON CHOVANEC, male   DOB: 03-24-1956, 59 y.o.   MRN: [id number]    Patient Name: Cory Nolan Date of Encounter: 03/17/2015     Principal Problem:   Bilateral pulmonary embolism, multiple 03/13/15 Active Problems:   Dyspnea   SOB (shortness of breath)   CAD (coronary artery disease), native coronary artery   Obesity   Chest pain   S/P angioplasty with stent-RCA prox and distal-DES 03/06/15   Diarrhea    SUBJECTIVE  C/o righ groin pain. No chest pain or sob.   CURRENT MEDS . aspirin EC  81 mg Oral Daily  . atorvastatin  80 mg Oral q1800  . coumadin book   Does not apply Once  . metoprolol tartrate  12.5 mg Oral BID  . ticagrelor  90 mg Oral BID  . warfarin  7.5 mg Oral ONCE-1800  . Warfarin - Pharmacist Dosing Inpatient   Does not apply q1800    OBJECTIVE  Filed Vitals:   03/16/15 1941 03/16/15 2316 03/17/15 0320 03/17/15 0750  BP: 132/61 112/70 94/48 94/50   Pulse: 71 71 62   Temp: 98.9 F (37.2 C) 98.3 F (36.8 C) 98 F (36.7 C) 97.9 F (36.6 C)  TempSrc: Oral Oral Oral Oral  Resp: Height:      Weight:      SpO2: 95% 97% 94% 96%    Intake/Output Summary (Last 24 hours) at 03/17/15 0927 Last data filed at 03/17/15 0220  Gross per 24 hour  Intake   1240 ml  Output    550 ml  Net    690 ml   Filed Weights   03/13/15 2345 03/16/15 1400  Weight: 330 lb 1.6 oz (149.732 kg) 331 lb 3.2 oz (150.231 kg)    PHYSICAL EXAM  General: Pleasant, obese, NAD. Neuro: Alert and oriented X 3. Moves all extremities spontaneously. Psych: Normal affect. HEENT:  Normal  Neck: Supple without bruits or JVD. Lungs:  Resp regular and unlabored, CTA. Heart: RRR no s3, s4, or murmurs. Abdomen: Soft, non-tender, non-distended, BS + x 4.  Extremities: No clubbing, cyanosis or edema. DP/PT/Radials 2+ and equal bilaterally. Right groin with minimal tenderness  Accessory Clinical Findings  CBC  Recent Labs  03/16/15 0245 03/17/15 0239  WBC  6.5 6.5  HGB 12.8* 13.0  HCT 38.7* 38.3*  MCV 87.4 88.2  PLT 181 175   Basic Metabolic Panel No results for input(s): NA, K, CL, CO2, GLUCOSE, BUN, CREATININE, CALCIUM, MG, PHOS in the last 72 hours. Liver Function Tests No results for input(s): AST, ALT, ALKPHOS, BILITOT, PROT, ALBUMIN in the last 72 hours. No results for input(s): LIPASE, AMYLASE in the last 72 hours. Cardiac Enzymes No results for input(s): CKTOTAL, CKMB, CKMBINDEX, TROPONINI in the last 72 hours. BNP Invalid input(s): POCBNP D-Dimer No results for input(s): DDIMER in the last 72 hours. Hemoglobin A1C No results for input(s): HGBA1C in the last 72 hours. Fasting Lipid Panel No results for input(s): CHOL, HDL, LDLCALC, TRIG, CHOLHDL, LDLDIRECT in the last 72 hours. Thyroid Function Tests No results for input(s): TSH, T4TOTAL, T3FREE, THYROIDAB in the last 72 hours.  Invalid input(s): FREET3  TELE  nsr  Radiology/Studies  Dg Chest 2 View  03/13/2015   CLINICAL DATA:  Chest pain, difficulty breathing.  EXAM: CHEST  2 VIEW  COMPARISON:  March 08, 2015.  FINDINGS: The heart size and mediastinal contours are within normal limits. Both lungs are clear. No pneumothorax or pleural  effusion is noted. Anterior osteophyte formation is noted in the lower thoracic spine.  IMPRESSION: No active cardiopulmonary disease.   Electronically Signed   By: Lupita Raider, M.D.   On: 03/13/2015 13:54   Dg Chest 2 View  03/08/2015   CLINICAL DATA:  LEFT-sided chest pain began 1 day ago. Shortness of breath intermittently. Ex smoker.  EXAM: CHEST  2 VIEW  COMPARISON:  None.  FINDINGS: Hyperinflation suggesting COPD. No nodule, infiltrate, or edema. Normal cardiomediastinal silhouette. Thoracic atherosclerosis. Degenerative change in the thoracic spine.  IMPRESSION: No active cardiopulmonary disease.COPD.   Electronically Signed   By: Elsie Stain M.D.   On: 03/08/2015 11:23   Ct Angio Chest Pe W/cm &/or Wo Cm  03/13/2015    CLINICAL DATA:  Severe shortness of breath, elevated D-dimer  EXAM: CT ANGIOGRAPHY CHEST WITH CONTRAST  TECHNIQUE: Multidetector CT imaging of the chest was performed using the standard protocol during bolus administration of intravenous contrast. Multiplanar CT image reconstructions and MIPs were obtained to evaluate the vascular anatomy.  CONTRAST:  OMNIPAQUE IOHEXOL 350 MG/ML SOLN IV  COMPARISON:  None  FINDINGS: Aorta normal caliber without aneurysm or dissection.  Scattered atherosclerotic calcifications aorta, proximal great vessels and coronary arteries.  Pulmonary arteries well opacified.  Multiple filling defects identified within pulmonary arteries bilaterally compatible with extensive pulmonary embolism.  This includes emboli within both upper lobes, RIGHT middle lobe and BILATERAL lower lobes.  Saddle emboli identified at bifurcations of RIGHT pulmonary artery and descending interlobar pulmonary artery.  Elevated RV/LV ratio = 1.11.  Questionable wall thickening of the distal esophagus.  Visualized upper abdomen unremarkable.  No thoracic adenopathy.  11 x 6 mm diameter area of nodular thickening along minor fissure image 49.  Remaining lungs clear.  No pleural effusion or pneumothorax.  Review of the MIP images confirms the above findings.  IMPRESSION: Multiple BILATERAL pulmonary emboli including a saddle embolus at the bifurcation of the RIGHT pulmonary artery.  Positive for acute PE with CT evidence of right heart strain (RV/LV Ratio = 1.11) consistent with at least submassive (intermediate risk) PE. The presence of right heart strain has been associated with an increased risk of morbidity and mortality. Please activate Code PE by paging [phone].  Findings called to Dr. Mayford Knife on 03/13/2015 at 2146 hr.   Electronically Signed   By: Ulyses Southward M.D.   On: 03/13/2015 21:46    ASSESSMENT AND PLAN  1. Acute bilateral pulmonary emboli - hemodynamiclly stable now on IV heparin,  transitioning to coumadin. INR is slowly going out. 1.4 today. 2. Left leg/groin pain - wonder if this could be related to DVT. Patient not interested in ultrasound. "That hurts". Will ambulate. If pain persists, will check CT of pelvis looking for bleeding 3. Massive obesity - he will need weight loss.  4. CAD with fresh stent - continue anti-platelet agents.   Gregg Taylor,M.D.  03/17/2015 9:27 AM

## 2015-03-17 NOTE — Progress Notes (Signed)
ANTICOAGULATION CONSULT NOTE - Follow Up Consult  Pharmacy Consult for heparin and warfarin Indication: pulmonary embolus  No Known Allergies  Patient Measurements: Height:  (190.5 cm) Weight: (!) 331 lb 3.2 oz (150.231 kg) IBW/kg (Calculated) : 84.5 Heparin Dosing Weight: 120 kg  Vital Signs: Temp: 97.9 F (36.6 C) (09/25 0750) Temp Source: Oral (09/25 0750) BP: 94/50 mmHg (09/25 0750) Pulse Rate: 62 (09/25 0320)  Labs:  Recent Labs  03/15/15 0246 03/16/15 0245 03/17/15 0239  HGB 14.1 12.8* 13.0  HCT 42.2 38.7* 38.3*  PLT 179 181 175  LABPROT 15.4* 15.0 17.5*  INR 1.20 1.16 1.42  HEPARINUNFRC 0.44 0.44 0.49    Estimated Creatinine Clearance: 131 mL/min (by C-G formula based on Cr of 0.94).   Assessment: 59yoM admitted 03/13/2015 with SOB, fatigue, lower back pain and chest pressure. CT of chest showed bilateral submassive PEs with RV strain.   PMH: CAD (DESx2 placed 03/08/15), HLD, DM2  Day#4 overlap with Heparin/coumadin for bilateral submassive PEs. Heparin level 0.49 (therapeutic) on 2000 units/hr, INR 1.42 on coumadin, INR increased from 1.16 yesterday to 1.42 today.   H/H 14.1>>12.8>13.0, Pltc 181K >175K stable, no bleeding noted. RN notes patient complaining of severe groin pain with ambulation. RN states Dr. Ladona Ridgel just examined patient this AM and believes pt pulled a muscle. No bleeding or hematoma noted per RN's report.   Also on ASA and Brilinta. 59y.o male , 94ft 3in, 149.7 kg   Goal of Therapy:  INR 2-3 Heparin level 0.3-0.7 units/ml Monitor platelets by anticoagulation protocol: Yes   Plan:  - Continue heparin 2000 units/hr  - Will give warfarin 7.5 mg x1 tonight - Monitor daily HL, INR, CBC and s/sx of bleeding Coumadin book & video charted as given on 03/14/15 by nurse. -Pharmacist will need to educate patient prior to discharge.   Noah Delaine, RPh Clinical Pharmacist Pager: [phone] 03/17/2015,8:43 AM

## 2015-03-18 ENCOUNTER — Ambulatory Visit: Payer: Self-pay | Admitting: Physician Assistant

## 2015-03-18 ENCOUNTER — Inpatient Hospital Stay: Payer: Self-pay | Admitting: Family Medicine

## 2015-03-18 ENCOUNTER — Inpatient Hospital Stay (HOSPITAL_COMMUNITY): Payer: Self-pay

## 2015-03-18 DIAGNOSIS — R7303 Prediabetes: Secondary | ICD-10-CM | POA: Diagnosis present

## 2015-03-18 DIAGNOSIS — I2510 Atherosclerotic heart disease of native coronary artery without angina pectoris: Secondary | ICD-10-CM

## 2015-03-18 DIAGNOSIS — Z9861 Coronary angioplasty status: Secondary | ICD-10-CM

## 2015-03-18 DIAGNOSIS — E785 Hyperlipidemia, unspecified: Secondary | ICD-10-CM

## 2015-03-18 DIAGNOSIS — E118 Type 2 diabetes mellitus with unspecified complications: Secondary | ICD-10-CM

## 2015-03-18 DIAGNOSIS — R0600 Dyspnea, unspecified: Secondary | ICD-10-CM

## 2015-03-18 DIAGNOSIS — I82441 Acute embolism and thrombosis of right tibial vein: Secondary | ICD-10-CM

## 2015-03-18 HISTORY — DX: Prediabetes: R73.03

## 2015-03-18 HISTORY — DX: Hyperlipidemia, unspecified: E78.5

## 2015-03-18 LAB — PROTIME-INR
INR: 1.55 — ABNORMAL HIGH (ref 0.00–1.49)
Prothrombin Time: 18.6 seconds — ABNORMAL HIGH (ref 11.6–15.2)

## 2015-03-18 LAB — CBC
HCT: 41 % (ref 39.0–52.0)
Hemoglobin: 13.5 g/dL (ref 13.0–17.0)
MCH: 29.3 pg (ref 26.0–34.0)
MCHC: 32.9 g/dL (ref 30.0–36.0)
MCV: 89.1 fL (ref 78.0–100.0)
PLATELETS: 188 10*3/uL (ref 150–400)
RBC: 4.6 MIL/uL (ref 4.22–5.81)
RDW: 13.8 % (ref 11.5–15.5)
WBC: 7.8 10*3/uL (ref 4.0–10.5)

## 2015-03-18 LAB — BASIC METABOLIC PANEL
ANION GAP: 6 (ref 5–15)
BUN: 8 mg/dL (ref 6–20)
CALCIUM: 9.5 mg/dL (ref 8.9–10.3)
CO2: 29 mmol/L (ref 22–32)
Chloride: 103 mmol/L (ref 101–111)
Creatinine, Ser: 0.8 mg/dL (ref 0.61–1.24)
GFR calc Af Amer: >60 mL/min (ref >60)
GLUCOSE: 104 mg/dL — AB (ref 65–99)
POTASSIUM: 4.2 mmol/L (ref 3.5–5.1)
Sodium: 138 mmol/L (ref 135–145)

## 2015-03-18 LAB — HEPARIN LEVEL (UNFRACTIONATED): HEPARIN UNFRACTIONATED: 0.32 [IU]/mL (ref 0.30–0.70)

## 2015-03-18 MED ORDER — ALUM & MAG HYDROXIDE-SIMETH 200-200-20 MG/5ML PO SUSP
30.0000 mL | Freq: Once | ORAL | Status: AC
  Administered 2015-03-18: 30 mL via ORAL
  Filled 2015-03-18: qty 30

## 2015-03-18 MED ORDER — DOCUSATE SODIUM 100 MG PO CAPS: 100.0000 mg | ORAL_CAPSULE | Freq: Two times a day (BID) | ORAL | Status: DC | PRN

## 2015-03-18 MED ORDER — HYDROCODONE-ACETAMINOPHEN 5-325 MG PO TABS
1.0000 | ORAL_TABLET | Freq: Four times a day (QID) | ORAL | Status: DC | PRN
  Administered 2015-03-20 – 2015-03-21 (×3): 1 via ORAL
  Filled 2015-03-18 (×3): qty 1

## 2015-03-18 MED ORDER — SENNA 8.6 MG PO TABS: 1.0000 | ORAL_TABLET | Freq: Every day | ORAL | Status: DC | PRN

## 2015-03-18 MED ORDER — WARFARIN SODIUM 7.5 MG PO TABS
7.5000 mg | ORAL_TABLET | Freq: Once | ORAL | Status: AC
  Administered 2015-03-18: 7.5 mg via ORAL
  Filled 2015-03-18: qty 1

## 2015-03-18 MED ORDER — MORPHINE SULFATE 15 MG PO TABS: 15.0000 mg | ORAL_TABLET | ORAL | Status: DC | PRN

## 2015-03-18 NOTE — Progress Notes (Signed)
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 No new orders.

## 2015-03-18 NOTE — Progress Notes (Signed)
ANTICOAGULATION CONSULT NOTE - Follow Up Consult  Pharmacy Consult for heparin and warfarin Indication: pulmonary embolus  No Known Allergies  Patient Measurements: Height:  (190.5 cm) Weight: (!) 331 lb 3.2 oz (150.231 kg) IBW/kg (Calculated) : 84.5 Heparin Dosing Weight: 120 kg  Vital Signs: Temp: 97.9 F (36.6 C) (09/26 0723) Temp Source: Oral (09/26 0723) BP: 108/49 mmHg (09/26 0723)  Labs:  Recent Labs  03/16/15 0245 03/17/15 0239 03/18/15 0234  HGB 12.8* 13.0 13.5  HCT 38.7* 38.3* 41.0  PLT 181 175 188  LABPROT 15.0 17.5* 18.6*  INR 1.16 1.42 1.55*  HEPARINUNFRC 0.44 0.49 0.32  CREATININE  --   --  0.80    Estimated Creatinine Clearance: 153.9 mL/min (by C-G formula based on Cr of 0.8).    Assessment: 59yoM admitted 03/13/2015 with SOB, fatigue, lower back pain and chest pressure. CT of chest showed bilateral submassive PEs with RV strain. Doppler pos for DVT R posterior tibial & peroneal veins. D-dimer elevated to 6.34. Baseline INR 1.20.  PMH: CAD (DESx2 placed 03/08/15), HLD, DM2  Day 5 heparin/warfarin bridge. HL 0.32 (therapeutic), INR 1.55. CBC wnl. Of note pt complaining of R groin pain today, unknown if could be hematoma however no s/sx of bleeding noted. Currently on triple therapy ASA, Brilinta, & warfarin.   Goal of Therapy:  INR 2-3 Heparin level 0.3-0.7 units/ml Monitor platelets by anticoagulation protocol: Yes   Plan:  - Continue heparin 2000 units/hr  - Warfarin 7.5mg  x1 tonight - Monitor daily HL, INR, CBC and s/sx of bleeding - F/u non-contrast CT    Hillery Aldo, [place].D. PGY2 Cardiology Pharmacy Resident Pager: [phone]  03/18/2015,10:08 AM

## 2015-03-18 NOTE — Progress Notes (Signed)
CARDIAC REHAB PHASE I   PRE:  Rate/Rhythm: 76 SR    BP: sitting 110/65    SaO2: 95 RA  MODE:  Ambulation: 2 steps forward and back   POST:  Rate/Rhythm: 93 SR    BP: sitting 142/80     SaO2: 95 RA  Pt sts that his left groin and right back/flank is very painful with movement. Denies pain at rest. Sts he did not walk at all yesterday except he went to BR x1. I attempted to walk pt. He had great difficulty standing, he had to support himself with his arms on recliner to stand. Gave pt RW for support. It took pt great effort and c/o pain with picking up left leg to move forward. Also c/o severe pain in right back, esp with movement. Sts he cannot take a deep breath in due to restriction. Pt returned to recliner. Became irritable with questioning. Reiterates that he is not in pain with rest. VSS.   [phone]  Elissa Lovett [place] CES, ACSM 03/18/2015 9:43 AM

## 2015-03-18 NOTE — Care Management Note (Signed)
Case Management Note  Patient Details  Name: Cory Nolan MRN: [id number] Date of Birth: 04-Dec-1955  Subjective/Objective:     Adm w bil pul emboli               Action/Plan: lives alone   Expected Discharge Date:                  Expected Discharge Plan:  Home/Self Care  In-House Referral:     Discharge planning Services  CM Consult, Medication Assistance, Indigent Health Clinic  Post Acute Care Choice:    Choice offered to:     DME Arranged:    DME Agency:     HH Arranged:    HH Agency:     Status of Service:     Medicare Important Message Given:    Date Medicare IM Given:    Medicare IM give by:    Date Additional Medicare IM Given:    Additional Medicare Important Message give by:     If discussed at Long Length of Stay Meetings, dates discussed:    Additional Comments: [facility] inform [facility]. He had appt there but in hosp and will resched at disch. Gave him another inform sheet on [facility] wellness clinic. Placed pt assist form on shadow chart for brilinta. He does have 30day filled. Will ask md to sign brilinta pt assist form and pt knows to ask for form and mail in w proof of income.  Hanley Hays, RN 03/18/2015, 2:25 PM

## 2015-03-18 NOTE — Progress Notes (Signed)
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 Main complaints: L Inguinal pain ("feels like a pulled groin) & R lateral /flank pain -- both significantly limit his activity level - unable to walk today b/c pain.    Objective:  Vital Signs in the last 24 hours: Temp:  [97.9 F (36.6 C)-98.8 F (37.1 C)] 97.9 F (36.6 C) (09/26 0723) Resp:  [20] 20 (09/26 0723) BP: (89-116)/(49-70) 108/49 mmHg (09/26 0723) SpO2:  [95 %-99 %] 99 % (09/26 0723)  Intake/Output from previous day: 09/25 0701 - 09/26 0700 In: 1520 [P.O.:740; I.V.:780] Out: 1100 [Urine:1100] Intake/Output from this shift: Total I/O In: 20 [I.V.:20] Out: -   Physical Exam: General appearance: alert, cooperative, appears stated age, no distress, morbidly obese and is uncomfortable from pain Neck: no adenopathy, no carotid bruit and cannot assess JVP due to obesity Lungs: clear to auscultation bilaterally, normal percussion bilaterally and non-labored Heart: RRR. Very distant Heart sounds - No obvious M/R/G; S1 & S2 normal.  Abdomen: soft, non-tender; bowel sounds normal; no masses,  no organomegaly and morbidly obese Extremities: extremities normal, atraumatic, no cyanosis or edema and L groin /inguinal region very tender to palpation (sore with movement). No obvious swelling, erythema or ecchymoses. Pulses: 2+ and symmetric Neurologic: Grossly normal; H/o L Eye trauma, R Flank tender to palpation - no ecchymoses  Lab Results:  Recent Labs  03/17/15 0239 03/18/15 0234  WBC 6.5 7.8  HGB 13.0 13.5  PLT 175 188    Recent  Labs  03/18/15 0234  NA 138  K 4.2  CL 103  CO2 29  GLUCOSE 104*  BUN 8  CREATININE 0.80   No results for input(s): TROPONINI in the last 72 hours.  Invalid input(s): CK, MB Hepatic Function Panel No results for input(s): PROT, ALBUMIN, AST, ALT, ALKPHOS, BILITOT, BILIDIR, IBILI in the last 72 hours. No results for input(s): CHOL in the last 72 hours. No results for input(s): PROTIME in the last 72 hours.  Imaging: Imaging results have been reviewed - no new studies Cardiac Studies: No new studies  Assessment/Plan:  Principal Problem:   Bilateral pulmonary embolism, multiple 03/13/15 (Acute DVT of right tibial vein):  ON IV Heparin with Warfarin (INR 1.5) - using warfarin 2/2 Brilinta.  Breathing much improved.  Pre-admission back pain now replaced with R flank & L inguinal pain  Due to concern for ? Ilipsoas bleed, with check Non-contrast CT Abd-Pelvis to exclude bleed & evaluate for soft-tissue injury.  Prn narcotics for pain -- no NSAIDs.  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  Not on meds currently - will need to be addressed as OP    Hyperlipidemia with target LDL less than 70 -- on high dose statin (will check CK levels).    Dyspnea - due to PE, improved.     LOS: 5 days    HARDING, DAVID W 03/18/2015, 9:59 AM

## 2015-03-19 DIAGNOSIS — K760 Fatty (change of) liver, not elsewhere classified: Secondary | ICD-10-CM

## 2015-03-19 DIAGNOSIS — I723 Aneurysm of iliac artery: Secondary | ICD-10-CM

## 2015-03-19 DIAGNOSIS — Z789 Other specified health status: Secondary | ICD-10-CM

## 2015-03-19 DIAGNOSIS — Z889 Allergy status to unspecified drugs, medicaments and biological substances status: Secondary | ICD-10-CM

## 2015-03-19 HISTORY — DX: Fatty (change of) liver, not elsewhere classified: K76.0

## 2015-03-19 HISTORY — DX: Aneurysm of iliac artery: I72.3

## 2015-03-19 LAB — HEPARIN LEVEL (UNFRACTIONATED): HEPARIN UNFRACTIONATED: 0.31 [IU]/mL (ref 0.30–0.70)

## 2015-03-19 LAB — CBC
HEMATOCRIT: 38.9 % — AB (ref 39.0–52.0)
Hemoglobin: 12.8 g/dL — ABNORMAL LOW (ref 13.0–17.0)
MCH: 29.2 pg (ref 26.0–34.0)
MCHC: 32.9 g/dL (ref 30.0–36.0)
MCV: 88.8 fL (ref 78.0–100.0)
PLATELETS: 186 10*3/uL (ref 150–400)
RBC: 4.38 MIL/uL (ref 4.22–5.81)
RDW: 13.9 % (ref 11.5–15.5)
WBC: 7.5 10*3/uL (ref 4.0–10.5)

## 2015-03-19 LAB — PROTIME-INR
INR: 1.67 — ABNORMAL HIGH (ref 0.00–1.49)
Prothrombin Time: 19.7 seconds — ABNORMAL HIGH (ref 11.6–15.2)

## 2015-03-19 MED ORDER — WARFARIN SODIUM 7.5 MG PO TABS
7.5000 mg | ORAL_TABLET | Freq: Once | ORAL | Status: AC
  Administered 2015-03-19: 7.5 mg via ORAL
  Filled 2015-03-19: qty 1

## 2015-03-19 NOTE — Progress Notes (Signed)
6:00pm  Received patient from [facility], alert & oriented, vital signs stable, Denies any pain at this time.  Governor Specking, RN

## 2015-03-19 NOTE — Progress Notes (Signed)
Pt just walked with RN. His left knee/leg is swollen in recliner. Long discussion of good food choices and carb counting. Voiced understanding. Highly encouraged [facility] who have already called him. He does not have insurance therefore might do the maintenance/self pay program. Will f/u. [id number] Cory Nolan CES, ACSM 3:32 PM 03/19/2015

## 2015-03-19 NOTE — Evaluation (Signed)
Physical Therapy Evaluation Patient Details Name: Cory Nolan MRN: [id number] DOB: [birth year] Today's Date: 03/19/2015   History of Present Illness  59 yo with history of CAD presents with complaints of shortness of breath, fatigue, lower back pain and chest pressure. Pt with bil PE, RLE DVT and Right iliac anerysm  Clinical Impression  Pt pleasant and very eager to return to independent function. Pt reporting tightness and fatigue of LLE limiting function. Pt educated for RW use, safety and encouragement to continue mobilizing. Will follow acutely to maximize gait, balance and function to decrease fall risk and return pt to independent functional level.     Follow Up Recommendations Home health PT    Equipment Recommendations  Rolling walker with 5" wheels    Recommendations for Other Services       Precautions / Restrictions Precautions Precautions: Fall Restrictions Weight Bearing Restrictions: No      Mobility  Bed Mobility               General bed mobility comments: in chair on arrival  Transfers Overall transfer level: Needs assistance   Transfers: Sit to/from Stand Sit to Stand: Min guard         General transfer comment: cues for hand placement and safety as pt reaching out for environmental support  Ambulation/Gait Ambulation/Gait assistance: Supervision Ambulation Distance (Feet): 150 Feet Assistive device: Rolling walker (2 wheeled) Gait Pattern/deviations: Step-to pattern;Wide base of support;Trunk flexed;Decreased stride length   Gait velocity interpretation: Below normal speed for age/gender General Gait Details: pt with cues for RW use as initially with gait reaching out to hold door, door frame and anything he could grab. Much safer gait with use of RW, decreased stance on LLE and decreased stride  Stairs            Wheelchair Mobility    Modified Rankin (Stroke Patients Only)       Balance Overall balance assessment: Needs  assistance   Sitting balance-Leahy Scale: Good       Standing balance-Leahy Scale: Poor                               Pertinent Vitals/Pain Pain Assessment: 0-10 Pain Score: 4  Pain Location: left knee and hamstring Pain Descriptors / Indicators: Aching Pain Intervention(s): Repositioned;Limited activity within patient's tolerance    Home Living Family/patient expects to be discharged to:: Private residence Living Arrangements: Alone   Type of Home: House Home Access: Stairs to enter   Secretary/administrator of Steps: 1 Home Layout: One level Home Equipment: None      Prior Function Level of Independence: Independent         Comments: pt works on Engineer, manufacturing systems, 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      Higher education careers adviser        Extremity/Trunk Assessment   Upper Extremity Assessment: Overall WFL for tasks assessed           Lower Extremity Assessment: Overall WFL for tasks assessed         Communication   Communication: No difficulties  Cognition Arousal/Alertness: Awake/alert Behavior During Therapy: WFL for tasks assessed/performed Overall Cognitive Status: Within Functional Limits for tasks assessed                      General Comments      Exercises        Assessment/Plan  PT Assessment Patient needs continued PT services  PT Diagnosis Difficulty walking;Acute pain   PT Problem List Decreased activity tolerance;Decreased balance;Decreased mobility;Decreased knowledge of use of DME  PT Treatment Interventions Gait training;DME instruction;Stair training;Patient/family education;Therapeutic activities;Therapeutic exercise;Functional mobility training   PT Goals (Current goals can be found in the Care Plan section) Acute Rehab PT Goals Patient Stated Goal: return to my dogs and farm PT Goal Formulation: With patient Time For Goal Achievement: 04/02/15 Potential to Achieve Goals: Good    Frequency Min  3X/week   Barriers to discharge Decreased caregiver support      Co-evaluation               End of Session   Activity Tolerance: Patient tolerated treatment well Patient left: in chair;with call bell/phone within reach Nurse Communication: Mobility status         Time: 0805-0829 PT Time Calculation (min) (ACUTE ONLY): 24 min   Charges:   PT Evaluation $Initial PT Evaluation Tier I: 1 Procedure PT Treatments $Therapeutic Activity: 8-22 mins   PT G CodesDelorse Lek 03/19/2015, 10:22 AM Delaney Meigs, PT [phone]

## 2015-03-19 NOTE — Progress Notes (Signed)
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 He stopped taking Lipitor yesterday, and notes much improvement in his flank and groin pain. Was able to walk today without as much difficulty. He has been using nighttime oxygen (he has a CPAP machine at home but has not been evaluated for sleep apnea. He states that this helps)  Objective:  Vital Signs in the last 24 hours: Temp:  [98.3 F (36.8 C)-98.7 F (37.1 C)] 98.3 F (36.8 C) (09/27 1135) Pulse Rate:  [62-73] 73 (09/27 0728) Resp:  [20-22] 20 (09/27 1135) BP: (92-123)/(48-78) 123/78 mmHg (09/27 1135) SpO2:  [97 %-99 %] 98 % (09/27 1135)  Intake/Output from previous day: 09/26 0701 - 09/27 0700 In: 1120 [P.O.:660; I.V.:460] Out: 2725 [Urine:2725] Intake/Output from this shift: Total I/O In: 470 [P.O.:350; I.V.:120] Out: -   Physical Exam: General appearance: alert, cooperative, appears stated age, no distress, morbidly obese and is uncomfortable from pain Neck: no adenopathy, no carotid bruit and cannot assess JVP due to obesity Lungs: clear to auscultation bilaterally, normal percussion bilaterally and non-labored Heart: RRR. Very distant Heart sounds - No obvious M/R/G; S1 & S2 normal.  Abdomen: soft, non-tender; bowel sounds normal; no masses,  no organomegaly and morbidly obese Extremities: extremities normal, atraumatic, no cyanosis or edema. Pulses: 2+ and symmetric Neurologic: Grossly  normal; H/o L Eye trauma, R Flank and left groin notably less tender  Lab Results:  Recent Labs  03/18/15 0234 03/19/15 1005  WBC 7.8 7.5  HGB 13.5 12.8*  PLT 188 186    Recent Labs  03/18/15 0234  NA 138  K 4.2  CL 103  CO2 29  GLUCOSE 104*  BUN 8  CREATININE 0.80   No results for input(s): TROPONINI in the last 72 hours.  Invalid input(s): CK, MB Hepatic Function Panel No results for input(s): PROT, ALBUMIN, AST, ALT, ALKPHOS, BILITOT, BILIDIR, IBILI in the last 72 hours. No results for input(s): CHOL in the last 72 hours. No results for input(s): PROTIME in the last 72 hours.  Imaging: Imaging results have been reviewed - CT scan of the abdomen: No evidence of RP hematoma. Normal gallbladder. No renal, ureteral or bladder calculi with no hydronephrosis. No bowel obstruction. Severe hepatic steatosis with focal fatty sparing; 2.7 cm right common iliac arterial aneurysm  Cardiac Studies: No new studies  Assessment/Plan:  Principal Problem:   Bilateral pulmonary embolism, multiple 03/13/15 (Acute DVT of right tibial vein):  ON IV Heparin with Warfarin (INR 1.67) - using warfarin 2/2 Brilinta.  Breathing much improved.  No evidence of bleed on CT scan yesterday.  Okay to continue on with anticoagulation  Prn narcotics for pain -- no NSAIDs.  Active Problems:         History of Unstable Angina --> S/P angioplasty with stent-RCA prox and distal-DES 03/06/15 -->CAD S/P PCI with DES to RCA 03/08/15  On ASA + Brilinta --  DC statin secondary to hepatic steatosis and possible statin related myalgias.  BP not high enough to tolerate ACE-I/ARB       Obesity- Morbid - concern for sleep apnea  CRH & dietary counseling  Would benefit from sleep study as an outpatient. He is hoping that this can be delayed until he is able to get insurance issues resolved.  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  Not on meds currently - will need to be addressed as OP     Hyperlipidemia with target LDL less than 70 --  significant myalgia symptoms have somewhat resolved having stopped taking statin. I will hold statin for now,. Especially in light of severe hepatic steatosisMarland Kitchen.  check CK level.   Hepatic steatosis: Likely related to him being severely obese. With him having significant myalgia symptoms I have stopped statin. He would likely benefit from GI/hepatology evaluation as an outpatient to determine the severity of his condition.   Right iliac artery aneurysm:  I discussed this with Dr. Nanetta Batty, this would be a lesion that should be monitored with outpatient Doppler or CT scan. These aneurysms are not usually treated unless they get beyond 3 cm in diameter.  Dyspnea - due to PE, improved.   As he is now symptom medically improved, and able to ambulate, we will transfer him to telemetry. Still waiting for INR greater than 2 with heparin bridging.    LOS: 6 days    HARDING, DAVID W 03/19/2015, 1:57 PM

## 2015-03-19 NOTE — Progress Notes (Signed)
Transferred to 2 west room 14 by wheelchair, stable, report given to RN. Belongings with pt.

## 2015-03-19 NOTE — Progress Notes (Signed)
ANTICOAGULATION CONSULT NOTE - Follow Up Consult  Pharmacy Consult for heparin and warfarin Indication: pulmonary embolus  No Known Allergies  Patient Measurements: Height:  (190.5 cm) Weight: (!) 331 lb 3.2 oz (150.231 kg) IBW/kg (Calculated) : 84.5 Heparin Dosing Weight: 120 kg  Vital Signs: Temp: 98.5 F (36.9 C) (09/27 0728) Temp Source: Oral (09/27 0728) BP: 92/48 mmHg (09/27 0728) Pulse Rate: 73 (09/27 0728)  Labs:  Recent Labs  03/17/15 0239 03/18/15 0234 03/19/15 0238 03/19/15 1005  HGB 13.0 13.5  --  12.8*  HCT 38.3* 41.0  --  38.9*  PLT 175 188  --  186  LABPROT 17.5* 18.6* 19.7*  --   INR 1.42 1.55* 1.67*  --   HEPARINUNFRC 0.49 0.32 0.31  --   CREATININE  --  0.80  --   --     Estimated Creatinine Clearance: 153.9 mL/min (by C-G formula based on Cr of 0.8).    Assessment: 59yoM admitted 03/13/2015 with SOB, fatigue, lower back pain and chest pressure. CT of chest showed bilateral submassive PEs with RV strain. Doppler pos for DVT R posterior tibial & peroneal veins. D-dimer elevated to 6.34. Baseline INR 1.20.  PMH: CAD (DESx2 placed 03/08/15), HLD, DM2  Day 6 heparin/warfarin bridge. HL 0.31 (therapeutic), INR 1.67, slowly rising up. CBC wnl. 2.7 cm R common iliac artery aneurysm found on CT abd/pelv yesterday. No noted bleeding. Currently on triple therapy ASA, Brilinta, & warfarin.   Goal of Therapy:  INR 2-3 Heparin level 0.3-0.7 units/ml Monitor platelets by anticoagulation protocol: Yes   Plan:  - Continue heparin 2000 units/hr  - Warfarin 7.5mg  x1 tonight - Monitor daily HL, INR, CBC and s/sx of bleeding    Hillery Aldo, Miles.D. PGY2 Cardiology Pharmacy Resident Pager: [phone]  03/19/2015,11:04 AM

## 2015-03-20 LAB — HEPARIN LEVEL (UNFRACTIONATED): Heparin Unfractionated: 0.43 IU/mL (ref 0.30–0.70)

## 2015-03-20 LAB — CBC
HEMATOCRIT: 38.7 % — AB (ref 39.0–52.0)
Hemoglobin: 12.9 g/dL — ABNORMAL LOW (ref 13.0–17.0)
MCH: 29.4 pg (ref 26.0–34.0)
MCHC: 33.3 g/dL (ref 30.0–36.0)
MCV: 88.2 fL (ref 78.0–100.0)
PLATELETS: 205 10*3/uL (ref 150–400)
RBC: 4.39 MIL/uL (ref 4.22–5.81)
RDW: 13.7 % (ref 11.5–15.5)
WBC: 7.2 10*3/uL (ref 4.0–10.5)

## 2015-03-20 LAB — BASIC METABOLIC PANEL
Anion gap: 8 (ref 5–15)
BUN: 8 mg/dL (ref 6–20)
CALCIUM: 9.1 mg/dL (ref 8.9–10.3)
CO2: 28 mmol/L (ref 22–32)
Chloride: 100 mmol/L — ABNORMAL LOW (ref 101–111)
Creatinine, Ser: 0.73 mg/dL (ref 0.61–1.24)
GFR calc Af Amer: >60 mL/min (ref >60)
GLUCOSE: 100 mg/dL — AB (ref 65–99)
Potassium: 4 mmol/L (ref 3.5–5.1)
Sodium: 136 mmol/L (ref 135–145)

## 2015-03-20 LAB — PROTIME-INR
INR: 1.89 — ABNORMAL HIGH (ref 0.00–1.49)
Prothrombin Time: 21.6 seconds — ABNORMAL HIGH (ref 11.6–15.2)

## 2015-03-20 LAB — GLUCOSE, CAPILLARY: Glucose-Capillary: 112 mg/dL — ABNORMAL HIGH (ref 65–99)

## 2015-03-20 MED ORDER — OMEGA-3-ACID ETHYL ESTERS 1 G PO CAPS
1.0000 g | ORAL_CAPSULE | Freq: Two times a day (BID) | ORAL | Status: DC
  Administered 2015-03-20 – 2015-03-22 (×5): 1 g via ORAL
  Filled 2015-03-20 (×5): qty 1

## 2015-03-20 MED ORDER — WARFARIN SODIUM 10 MG PO TABS
10.0000 mg | ORAL_TABLET | Freq: Once | ORAL | Status: AC
  Administered 2015-03-20: 10 mg via ORAL
  Filled 2015-03-20: qty 1

## 2015-03-20 NOTE — Progress Notes (Signed)
ANTICOAGULATION CONSULT NOTE - Follow Up Consult  Pharmacy Consult for heparin and warfarin Indication: pulmonary embolus  No Known Allergies  Patient Measurements: Height:  (190.5 cm) Weight: (!) 331 lb 3.2 oz (150.231 kg) IBW/kg (Calculated) : 84.5 Heparin Dosing Weight: 120 kg  Vital Signs: Temp: 98 F (36.7 C) (09/28 0445) Temp Source: Oral (09/28 0445) BP: 115/65 mmHg (09/28 0445) Pulse Rate: 63 (09/28 0445)  Labs:  Recent Labs  03/18/15 0234 03/19/15 0238 03/19/15 1005 03/20/15 0440  HGB 13.5  --  12.8* 12.9*  HCT 41.0  --  38.9* 38.7*  PLT 188  --  186 205  LABPROT 18.6* 19.7*  --  21.6*  INR 1.55* 1.67*  --  1.89*  HEPARINUNFRC 0.32 0.31  --  0.43  CREATININE 0.80  --   --  0.73    Estimated Creatinine Clearance: 153.9 mL/min (by C-G formula based on Cr of 0.73).   Assessment: 59yoM admitted 03/13/2015 with SOB, fatigue, lower back pain and chest pressure. CT of chest showed bilateral submassive PEs with RV strain. Doppler pos for DVT R posterior tibial & peroneal veins. D-dimer elevated to 6.34. Baseline INR 1.20.  PMH: CAD (DESx2 placed 03/08/15), HLD, DM2  Day 7 heparin/warfarin bridge. HL remains therapeutic at 0.43 units/mL. INR up to 1.89- slow rise, however with 2.7 cm R common iliac artery aneurysm found on CT abd/pelvis, dosing has been cautious.  No noted bleeding. Currently on triple therapy ASA, Brilinta, & warfarin.   Goal of Therapy:  INR 2-3 Heparin level 0.3-0.7 units/ml Monitor platelets by anticoagulation protocol: Yes   Plan:  - Continue heparin 2000 units/hr  - Warfarin  x1 tonight- hopeful he will be therapeutic in the morning. Anticipate he will need a warfarin dose of 7.5mg  daily. Recommend INR check on Monday 10/3. - Monitor daily HL, INR, CBC and s/sx of bleeding - will educate patient on warfarin prior to discharge   Lauren D. Bajbus, PharmD, BCPS Clinical Pharmacist Pager: [phone] 03/20/2015 10:24 AM

## 2015-03-20 NOTE — Progress Notes (Signed)
Patient ambulated in hallway about 500 feet without difficulty and on room air.  Patient did complain of some tightness in his legs but noted he contributes it to the lipitor. Patient returned to his room and call bell within reach. Will continue to monitor. Bradley Ferris RN BSN 03/20/2015 5:15 PM

## 2015-03-20 NOTE — Progress Notes (Signed)
Subjective: Feels a little anxious perhaps at night.  Maybe SOB  Objective: Vital signs in last 24 hours: Temp:  [98 F (36.7 C)-98.4 F (36.9 C)] 98 F (36.7 C) (09/28 0445) Pulse Rate:  [63-71] 63 (09/28 0445) Resp:  [18-20] 20 (09/28 0445) BP: (106-115)/(64-65) 115/65 mmHg (09/28 0445) SpO2:  [95 %-100 %] 95 % (09/28 0445) Last BM Date: 03/18/15  Intake/Output from previous day: 09/27 0701 - 09/28 0700 In: 1040 [P.O.:600; I.V.:440] Out: 1575 [Urine:1575] Intake/Output this shift: Total I/O In: -  Out: 750 [Urine:750]  Medications Scheduled Meds: . aspirin EC  81 mg Oral Daily  . metoprolol tartrate  12.5 mg Oral BID  . ticagrelor  90 mg Oral BID  . warfarin  10 mg Oral ONCE-1800  . Warfarin - Pharmacist Dosing Inpatient   Does not apply q1800   Continuous Infusions: . heparin 2,000 Units/hr (03/20/15 0427)   PRN Meds:.acetaminophen, docusate sodium, HYDROcodone-acetaminophen, loperamide, morphine, nitroGLYCERIN, ondansetron (ZOFRAN) IV, senna  PE: General appearance: alert, cooperative and no distress Lungs: decreased BS bilaterally but appears clear.  Heart: regular rate and rhythm, S1, S2 normal, no murmur, click, rub or gallop Extremities: Trace LEE Pulses: 2+ and symmetric Skin: warm and dry Neurologic: Grossly normal  Lab Results:   Recent Labs  03/18/15 0234 03/19/15 1005 03/20/15 0440  WBC 7.8 7.5 7.2  HGB 13.5 12.8* 12.9*  HCT 41.0 38.9* 38.7*  PLT 188 186 205   BMET  Recent Labs  03/18/15 0234 03/20/15 0440  NA 138 136  K 4.2 4.0  CL 103 100*  CO2 29 28  GLUCOSE 104* 100*  BUN 8 8  CREATININE 0.80 0.73  CALCIUM 9.5 9.1   PT/INR  Recent Labs  03/18/15 0234 03/19/15 0238 03/20/15 0440  LABPROT 18.6* 19.7* 21.6*  INR 1.55* 1.67* 1.89*   Lipid Panel     Component Value Date/Time   CHOL 207* 03/09/2015 0515   TRIG 82 03/09/2015 0515   HDL 32* 03/09/2015 0515   CHOLHDL 6.5 03/09/2015 0515   VLDL 16 03/09/2015 0515     LDLCALC 159* 03/09/2015 0515    Assessment/Plan Active Problems:   Bilateral pulmonary embolism, multiple 03/13/15 (Acute DVT of right tibial vein):  ON IV Heparin with Warfarin (INR 1.89) - using warfarin 2/2 Brilinta.  Breathing much improved.  No evidence of bleed on CT scan yesterday. Okay to continue on with anticoagulation  Prn narcotics for pain -- no NSAIDs.      History of Unstable Angina --> S/P angioplasty with stent-RCA prox and distal-DES 03/06/15 -->CAD S/P PCI with DES to RCA 03/08/15  On ASA + Brilinta --   DC statin secondary to hepatic steatosis and possible statin related myalgias.  BP not high enough to tolerate ACE-I/ARB     Obesity- Morbid - concern for sleep apnea  CRH & dietary counseling.  He was referred to an OP dietician.   Would benefit from sleep study as an outpatient. He is hoping that this can be delayed until he is able to get insurance issues resolved.  For now use nighttime oxygen   Diabetes mellitus type 2 with complications - CAD;   New Dx - glycemic control remains stable; no need for SSI.  Not on meds currently - will need to be addressed as OP    Hyperlipidemia with target LDL less than 70 -- significant myalgia symptoms have somewhat resolved having stopped taking statin. Hold statin for now,. Especially in light of severe hepatic  steatosis..   CK level in process.  Lovaza added    Hepatic steatosis: Likely related to him being severely obese. With him having significant myalgia symptoms I have stopped statin. He would likely benefit from GI/hepatology evaluation as an outpatient to determine the severity of his condition.   Right iliac artery aneurysm: Discussed this with Dr. Nanetta Batty, this would be a lesion that should be monitored with outpatient Doppler or CT scan. These aneurysms are not usually treated unless they get beyond 3 cm in diameter.  Dyspnea - due to PE, improved. Likely DC  home tomorrow.     LOS: 7 days    HAGER, BRYAN PA-C 03/20/2015 12:12 PM  I have seen, examined and evaluated the patient this PM along with Mr. Leron Croak.  After reviewing all the available data and chart,  I agree with his findings, examination as well as impression recommendations.  Doing much better today - no more pain. No bleeding issues. NO SOB.  INR almost therapeutic -- if > 2 tomorrow will probably consider d/c. BP will not tolerate much more than low dose BB. NO statin for now - change to Lovaza.  Will need OP GI/Hepatology.  Hope for d/c in 1-2 days based upon INR   HARDING, Piedad Climes, M.D., M.S. Interventional Cardiologist   Pager # [phone]

## 2015-03-20 NOTE — Care Management Note (Addendum)
Case Management Note  Patient Details  Name: Cory Nolan MRN: [id number] Date of Birth: Dec 29, 1955  Subjective/Objective:     Adm w bil pul emboli               Action/Plan: lives alone   Expected Discharge Date:                  Expected Discharge Plan:  Home/Self Care  In-House Referral:     Discharge planning Services  CM Consult, Medication Assistance, Indigent Health Clinic  Post Acute Care Choice:    Choice offered to:     DME Arranged:  RW DME Agency:  [facility] (charity)  HH Arranged:    HH Agency:     Status of Service:  In progress, will continue to monitor  Medicare Important Message Given:    Date Medicare IM Given:    Medicare IM give by:    Date Additional Medicare IM Given:    Additional Medicare Important Message give by:     If discussed at Long Length of Stay Meetings, dates discussed:   03/20/2015 CM assessed pt, pt stated he was independent at home recently discharged from hospital on Brilinta.  CM with pt counted Brilinta at bedside, pt has 52 pills.  CM provided Brilinta Assistance phone number and requested pt to contact company as soon as possible, pt is self pay.  Paper copy of assistance placed on shadow chart 03/18/15, CM informed pt that paper copy will be provided prior to discharge.  CM encouraged pt to ask cardiologist for samples.  Pt admitted hardship to pay total cost; however pt stated he could pay for it if he had to, "if I have to have it to live".  Pt acknowledge importance of not missing a dose due to lack of medication.  Pt cost would be approximately $326 per month will [employer] Coupon, coupon provided to pt.  CM will contact attending and remind that assistance hard copy has been placed on shadow chart.  CM contacted [facility], was told appointment can not be made until tomorrow morning due to availability with schedule.  Pt accepted charity RW as recommended by therapy, [facility] contacted, referral accepted.    Additional Comments: gave pt inform on  [place] [facility] wellness [facility]. He had appt there but in hosp and will resched at disch. Gave him another inform sheet on health and wellness clinic. Placed pt assist form on shadow chart for brilinta. He does have 30day filled. Will ask md to sign brilinta pt assist form and pt knows to ask for form and mail in w proof of income.  Cherylann Parr, RN 03/20/2015, 3:01 PM

## 2015-03-21 DIAGNOSIS — I200 Unstable angina: Secondary | ICD-10-CM

## 2015-03-21 LAB — CBC
HEMATOCRIT: 39.6 % (ref 39.0–52.0)
Hemoglobin: 13.1 g/dL (ref 13.0–17.0)
MCH: 29.2 pg (ref 26.0–34.0)
MCHC: 33.1 g/dL (ref 30.0–36.0)
MCV: 88.2 fL (ref 78.0–100.0)
Platelets: 207 10*3/uL (ref 150–400)
RBC: 4.49 MIL/uL (ref 4.22–5.81)
RDW: 13.7 % (ref 11.5–15.5)
WBC: 6.5 10*3/uL (ref 4.0–10.5)

## 2015-03-21 LAB — PROTIME-INR
INR: 1.95 — ABNORMAL HIGH (ref 0.00–1.49)
Prothrombin Time: 22.2 seconds — ABNORMAL HIGH (ref 11.6–15.2)

## 2015-03-21 LAB — CK ISOENZYMES
CK MB: 0 % (ref 0–3)
CK MM: 100 % (ref 97–100)
CK-BB: 0 %
CREATINE KINASE-TOTAL: 52 U/L (ref 24–204)
MACRO TYPE 1: 0 %
Macro Type 2: 0 %

## 2015-03-21 LAB — HEPARIN LEVEL (UNFRACTIONATED): Heparin Unfractionated: 0.3 IU/mL (ref 0.30–0.70)

## 2015-03-21 MED ORDER — WARFARIN SODIUM 10 MG PO TABS
10.0000 mg | ORAL_TABLET | Freq: Once | ORAL | Status: AC
  Administered 2015-03-21: 10 mg via ORAL
  Filled 2015-03-21: qty 1

## 2015-03-21 NOTE — Discharge Instructions (Signed)

## 2015-03-21 NOTE — Progress Notes (Signed)
CARDIAC REHAB PHASE I   PRE:  Rate/Rhythm: 74 SR  BP:  Sitting: 117/64        SaO2: 97 RA  MODE:  Ambulation: 480 ft   POST:  Rate/Rhythm: 86 SR  BP:  Sitting: 128/78         SaO2: 100 RA  Pt ambulated 480 ft on RA, rolling walker, steady gait, tolerated well. Pt does continue to complain of DOE, c/o L foot stiffness, states he thinks it is still residual pain from lipitor, denies CP, dizziness, declined rest stop. Pt to bed after walk, call bell within reach. Pt states he hopes to walk again this evening. Will continue to follow.  [phone]   Joylene Grapes, RN, BSN 03/21/2015 1:59 PM

## 2015-03-21 NOTE — Progress Notes (Signed)
ANTICOAGULATION CONSULT NOTE - Follow Up Consult  Pharmacy Consult for heparin and warfarin Indication: pulmonary embolus  No Known Allergies  Patient Measurements: Height:  (190.5 cm) Weight: (!) 329 lb 12.8 oz (149.596 kg) IBW/kg (Calculated) : 84.5 Heparin Dosing Weight: 120 kg  Vital Signs: Temp: 98.3 F (36.8 C) (09/29 0513) Temp Source: Oral (09/29 0513) BP: 102/65 mmHg (09/29 0513) Pulse Rate: 66 (09/29 0513)  Labs:  Recent Labs  03/19/15 0238  03/19/15 1005 03/19/15 1520 03/20/15 0440 03/21/15 0344  HGB  --   < > 12.8*  --  12.9* 13.1  HCT  --   --  38.9*  --  38.7* 39.6  PLT  --   --  186  --  205 207  LABPROT 19.7*  --   --   --  21.6* 22.2*  INR 1.67*  --   --   --  1.89* 1.95*  HEPARINUNFRC 0.31  --   --   --  0.43 0.30  CREATININE  --   --   --   --  0.73  --   CKMB  --   --   --  0  --   --   < > = values in this interval not displayed.  Estimated Creatinine Clearance: 153.5 mL/min (by C-G formula based on Cr of 0.73).   Assessment: 59yoM admitted 03/13/2015 with SOB, fatigue, lower back pain and chest pressure. CT of chest showed bilateral submassive PEs with RV strain. Doppler pos for DVT R posterior tibial & peroneal veins. D-dimer elevated to 6.34. Baseline INR 1.20.  PMH: CAD (DESx2 placed 03/08/15), HLD, DM2  Day 8 heparin/warfarin bridge. HL remains therapeutic at 0.3 units/mL. INR up to 1.95- slow rise, however with 2.7 cm R common iliac artery aneurysm found on CT abd/pelvis, dosing has been cautious.  Note: full effects of last night's warfarin dose are not seen on this morning's lab d/t timing of administration and timing of lab draws. Would expect INR to be therapeutic later this evening.  No noted bleeding. CBC is stable  Goal of Therapy:  INR 2-3 Heparin level 0.3-0.7 units/ml Monitor platelets by anticoagulation protocol: Yes   Plan:  - Continue heparin 2000 units/hr  - Warfarin  x1 tonight, then start 7.5mg  daily at  discharge. Recommend INR check on Monday 10/3. - Monitor daily HL, INR, CBC and s/sx of bleeding - will educate patient on warfarin   Lauren D. Bajbus, PharmD, BCPS Clinical Pharmacist Pager: [phone] 03/21/2015 9:14 AM

## 2015-03-21 NOTE — Care Management Note (Addendum)
Case Management Note  Patient Details  Name: Cory Nolan MRN: [id number] Date of Birth: 09-11-1955  Subjective/Objective:     Adm w bil pul emboli               Action/Plan: lives alone   EDIEZEL MAZURharge Date:                  Expected Discharge Plan:  Home/Self Care  In-House Referral:     Discharge planning Services  CM Consult, Medication Assistance, Indigent Health Clinic  Post Acute Care Choice:    Choice offered to:     DME Arranged:  RW DME Agency:  [facility] (charity)  HH Arranged:    HH Agency:     Status of Service:  Completed, will sign off  Medicare Important Message Given:    Date Medicare IM Given:    Medicare IM give by:    Date Additional Medicare IM Given:    Additional Medicare Important Message give by:     If discussed at Long Length of Stay Meetings, dates discussed: 03/21/15  03/22/2015 Prior to discharge; Cardiology PA [facility] informed CM that he contacted [facility] to arrange appointment for pt to have coumadin related blood draws early next week.  CM provided completed Brilinta Assistance Form to pt, CM instructed pt to mail off as soon as he could.  CM verified that pt:  has [facility] appt, pt understands that prescriptions can be filled immediately at [facility], has Brilinta coupon. Pt stated he has appointment with [employer] for [facility] application.  No other CM needs  CM requested during Progression rounds, physician sticky note, direct communication with PA for MD/PA to fill out hardcopy Brilinta assistance form.   Pt INR today 2.08.    03/21/2015  CM spoke with pt regarding Brilinta Assistance, pt contacted manufacture yesterday and was told he would receive $100 of each month via the assistance program , pt still needs to mail in hard copy form to manufacture.  Pt stated he could afford medication at this time.  CM verified pt still had [employer] coupon provided yesterday.  CM scheduled post discharge follow up appt with [facility] for 03/25/15 at noon.  CM  will provide pt with clinic brochure that includes date, time, location and items to bring to initial appointment.  CM will continue to monitor for disposition needs.  RW ordered is at bedside. CM requested during Progression rounds and on physician sticky note for MD to fill out hardcopy Brilinta assistance form.  03/20/15 CM assessed pt, pt stated he was independent at home recently discharged from hospital on Brilinta.  CM with pt counted Brilinta at bedside, pt has 52 pills.  CM provided Brilinta Assistance phone number and requested pt to contact company as soon as possible, pt is self pay.  Paper copy of assistance placed on shadow chart 03/18/15, CM informed pt that paper copy will be provided prior to discharge.  CM encouraged pt to ask cardiologist for samples.  Pt admitted hardship to pay total cost; however pt stated he could pay for it if he had to, "if I have to have it to live".  Pt acknowledge importance of not missing a dose due to lack of medication.  Pt cost would be approximately $326 per month will [employer] Coupon, coupon provided to pt.  CM will contact attending and remind that assistance hard copy has been placed on shadow chart.  CM contacted [facility], was told appointment can not be made  until tomorrow morning due to availability with schedule.  Pt accepted charity RW as recommended by therapy, [facility] contacted, referral accepted.    Additional Comments: gave pt inform on [place] and wellness clinic. He had appt there but in hosp and will resched at disch. Gave him another inform sheet on health and wellness clinic. Placed pt assist form on shadow chart for brilinta. He does have 30day filled. Will ask md to sign brilinta pt assist form and pt knows to ask for form and mail in w proof of income.  Cherylann Parr, RN 03/21/2015, 11:19 AM

## 2015-03-21 NOTE — Progress Notes (Signed)
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  Maybe SOB  Objective: Vital signs in last 24 hours: Temp:  [98.3 F (36.8 C)-98.7 F (37.1 C)] 98.3 F (36.8 C) (09/29 0513) Pulse Rate:  [66-86] 68 (09/29 1000) Resp:  [18] 18 (09/29 1000) BP: (102-132)/(55-70) 132/55 mmHg (09/29 1000) SpO2:  [97 %-98 %] 97 % (09/29 0513) Weight:  [329 lb 12.8 oz (149.596 kg)] 329 lb 12.8 oz (149.596 kg) (09/28 2039) Last BM Date: 03/18/15  Intake/Output from previous day: 09/28 0701 - 09/29 0700 In: 360 [P.O.:360] Out: 1950 [Urine:1950] Intake/Output this shift: Total I/O In: -  Out: 300 [Urine:300]  Medications Scheduled Meds: . aspirin EC  81 mg Oral Daily  . metoprolol tartrate  12.5 mg Oral BID  . omega-3 acid ethyl esters  1 g Oral BID  . ticagrelor  90 mg Oral BID  . warfarin  10 mg Oral ONCE-1800  . Warfarin - Pharmacist Dosing Inpatient   Does not apply q1800   Continuous Infusions: . heparin 2,000 Units/hr (03/21/15 0758)   PRN Meds:.acetaminophen, docusate sodium, HYDROcodone-acetaminophen, loperamide, morphine, nitroGLYCERIN, ondansetron (ZOFRAN) IV, senna  PE: General appearance: alert, cooperative and no distress Lungs: decreased BS bilaterally but appears clear.  Heart: regular rate and rhythm, S1, S2 normal, no murmur, click, rub or gallop Extremities: Trace LEE Pulses: 2+ and symmetric Skin: warm and dry Neurologic:  Grossly normal  Lab Results:   Recent Labs  03/19/15 1005 03/20/15 0440 03/21/15 0344  WBC 7.5 7.2 6.5  HGB 12.8* 12.9* 13.1  HCT 38.9* 38.7* 39.6  PLT 186 205 207   BMET  Recent Labs  03/20/15 0440  NA 136  K 4.0  CL 100*  CO2 28  GLUCOSE 100*  BUN 8  CREATININE 0.73  CALCIUM 9.1   PT/INR  Recent Labs  03/19/15 0238 03/20/15 0440 03/21/15 0344  LABPROT 19.7* 21.6* 22.2*  INR 1.67* 1.89* 1.95*   Lipid Panel     Component Value Date/Time   CHOL 207* 03/09/2015 0515   TRIG 82 03/09/2015 0515   HDL 32* 03/09/2015 0515   CHOLHDL 6.5 03/09/2015 0515   VLDL 16 03/09/2015 0515   LDLCALC 159* 03/09/2015 0515    Assessment/Plan.  Doing much better today - no more pain. No bleeding issues. NO SOB.  INR almost therapeutic -- if > 2 tomorrow will probably consider d/c. BP will not tolerate much more than low dose BB. NO statin for now - change to Lovaza.  Will need OP GI/Hepatology.  Active Problems:   1)Bilateral pulmonary embolism, multiple 03/13/15 (Acute DVT of right tibial vein):  ON IV Heparin with Warfarin (INR 1.89) - using warfarin 2/2 Brilinta.  Breathing much improved.  No evidence of bleed on CT scan yesterday. Okay to continue on with anticoagulation  Prn narcotics for pain -- no NSAIDs.      History of Unstable Angina --> S/P angioplasty  with stent-RCA prox and distal-DES 03/06/15 -->CAD S/P PCI with DES to RCA 03/08/15  On ASA + Brilinta --   DC statin secondary to hepatic steatosis and possible statin related myalgias.  BP not high enough to tolerate ACE-I/ARB     Obesity- Morbid - concern for sleep apnea  CRH & dietary counseling.  He was referred to an OP dietician.   Would benefit from sleep study as an outpatient. He is hoping that this can be delayed until he is able to get insurance issues resolved.  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  Not on meds currently - will need to be addressed as OP    Hyperlipidemia with target LDL less than 70 -- significant myalgia symptoms have somewhat resolved having stopped taking statin. Hold statin for now,. Especially in light of severe hepatic steatosis..   CK level in process.  Lovaza added    Hepatic steatosis: Likely related to him being severely obese. With him having significant myalgia symptoms I have stopped statin. He would likely benefit from GI/hepatology evaluation as an outpatient to determine the severity of his condition.   Right iliac artery aneurysm: Should be monitored with outpatient Doppler or CT scan. These aneurysms are not usually treated unless they get beyond 3 cm in diameter.  Dyspnea - due to PE, improved. Likely DC home tomorrow -provided INR is stable     Hope for d/c in 1-2 days based upon INR   HARDING, Piedad Climes, M.D., M.S. Interventional Cardiologist   Pager # [phone]

## 2015-03-21 NOTE — Progress Notes (Signed)
Physical Therapy Treatment and Discharge Patient Details Name: Cory Nolan MRN: [id number] DOB: [birth year] Today's Date: 03/21/2015    History of Present Illness 59 yo with history of CAD presents with complaints of shortness of breath, fatigue, lower back pain and chest pressure. Pt with bil PE, RLE DVT and Right iliac anerysm    PT Comments    Patient preparing to go home. Completed education re: safe use of RW and ultimately pt agreed to use RW to incr safety (very antalgic gait and unsteady without). Agrees with HHPT for safety evaluation as he has never used a RW in his home. Stair education completed. Pt with no further questions.   Follow Up Recommendations  Home health PT     Equipment Recommendations  Rolling walker with 5" wheels    Recommendations for Other Services       Precautions / Restrictions Precautions Precautions: Fall    Mobility  Bed Mobility               General bed mobility comments: sitting EOB on arrival  Transfers Overall transfer level: Needs assistance Equipment used: Rolling walker (2 wheeled);None Transfers: Sit to/from Stand Sit to Stand: Supervision;Modified independent (Device/Increase time)         General transfer comment: initial cues for safe use of RW; with repetition modified independent  Ambulation/Gait Ambulation/Gait assistance: Modified independent (Device/Increase time) Ambulation Distance (Feet): 250 Feet Assistive device: Rolling walker (2 wheeled) Gait Pattern/deviations: Step-through pattern;Decreased stride length;Antalgic;Wide base of support   Gait velocity interpretation: Below normal speed for age/gender General Gait Details: properly using RW; very slightly antalgic with RW (very antalgic and unsteady without RW)   Stairs Stairs:  (educated on 1 step with RW (demonstrated))          Wheelchair Mobility    Modified Rankin (Stroke Patients Only)       Balance     Sitting balance-Leahy  Scale: Good       Standing balance-Leahy Scale: Good                      Cognition Arousal/Alertness: Awake/alert Behavior During Therapy: WFL for tasks assessed/performed Overall Cognitive Status: Within Functional Limits for tasks assessed                      Exercises      General Comments        Pertinent Vitals/Pain SaO2 97% on room air; after walking 95%  Pain Assessment: No/denies pain    Home Living                      Prior Function            PT Goals (current goals can now be found in the care plan section) Acute Rehab PT Goals Patient 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   Please see latest Therapy Progress Note for current level of functioning and progress toward  goals.   Progress and discharge plan and discussed with patient/caregiver and they  Agree      Time: 1030-1101 PT Time Calculation (min) (ACUTE ONLY): 31 min  Charges:  $Gait Training: 8-22 mins                    G Codes:      SASSER,LYNN 2015-04-17, 12:37 PM Pager [phone]

## 2015-03-22 ENCOUNTER — Telehealth: Payer: Self-pay | Admitting: Nurse Practitioner

## 2015-03-22 LAB — CBC
HCT: 42 % (ref 39.0–52.0)
Hemoglobin: 13.6 g/dL (ref 13.0–17.0)
MCH: 28.9 pg (ref 26.0–34.0)
MCHC: 32.4 g/dL (ref 30.0–36.0)
MCV: 89.2 fL (ref 78.0–100.0)
PLATELETS: 241 10*3/uL (ref 150–400)
RBC: 4.71 MIL/uL (ref 4.22–5.81)
RDW: 13.8 % (ref 11.5–15.5)
WBC: 7.3 10*3/uL (ref 4.0–10.5)

## 2015-03-22 LAB — HEPARIN LEVEL (UNFRACTIONATED): Heparin Unfractionated: 0.25 IU/mL — ABNORMAL LOW (ref 0.30–0.70)

## 2015-03-22 LAB — PROTIME-INR
INR: 2.08 — AB (ref 0.00–1.49)
Prothrombin Time: 23.2 seconds — ABNORMAL HIGH (ref 11.6–15.2)

## 2015-03-22 MED ORDER — WARFARIN SODIUM 10 MG PO TABS
10.0000 mg | ORAL_TABLET | Freq: Once | ORAL | Status: AC
  Administered 2015-03-22: 10 mg via ORAL
  Filled 2015-03-22: qty 1

## 2015-03-22 MED ORDER — METOPROLOL TARTRATE 25 MG PO TABS: 12.5000 mg | ORAL_TABLET | Freq: Two times a day (BID) | ORAL | Status: DC

## 2015-03-22 MED ORDER — NITROGLYCERIN 0.4 MG SL SUBL: 0.4000 mg | SUBLINGUAL_TABLET | SUBLINGUAL | Status: AC | PRN

## 2015-03-22 MED ORDER — OMEGA-3-ACID ETHYL ESTERS 1 G PO CAPS: 1.0000 g | ORAL_CAPSULE | Freq: Two times a day (BID) | ORAL | Status: DC

## 2015-03-22 MED ORDER — WARFARIN SODIUM 5 MG PO TABS: 10.0000 mg | ORAL_TABLET | Freq: Every day | ORAL | Status: DC

## 2015-03-22 NOTE — Progress Notes (Signed)
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 Patient was sleepy. Patient states that he gets SOB with laying. He endorse snoring at night. He brought CPAP machine online about 6 months ago and uses every night. Wish to get sleep study in near furthers after he gets his insurance.   CURRENT MEDS . aspirin EC  81 mg Oral Daily  . metoprolol tartrate  12.5 mg Oral BID  . omega-3 acid ethyl esters  1 g Oral BID  . ticagrelor  90 mg Oral BID  . warfarin  10 mg Oral Once  . Warfarin - Pharmacist Dosing Inpatient   Does not apply q1800    OBJECTIVE  Filed Vitals:   03/21/15 1000 03/21/15 1507 03/21/15 2143 03/22/15 0604  BP: 132/55 108/65 109/74 143/88  Pulse: 68 69 65 85  Temp:  97.3 F (36.3 C) 98.5 F (36.9 C) 98.6 F (37 C)  TempSrc:  Oral Oral Oral  Resp: Height:      Weight:      SpO2:  92% 95% 100%    Intake/Output Summary (Last 24 hours) at 03/22/15 1053 Last data filed at 03/21/15 1300  Gross per 24 hour  Intake      0 ml  Output    450 ml  Net   -450 ml   Filed Weights   03/13/15 2345 03/16/15 1400 03/20/15 2039  Weight: 330 lb 1.6 oz (149.732 kg) 331 lb 3.2 oz (150.231 kg) 329 lb 12.8 oz (149.596 kg)    PHYSICAL EXAM  General: Pleasant, NAD. Neuro: Alert and oriented X 3. Moves all extremities spontaneously. Psych: Normal  affect. HEENT:  Normal  Neck: Supple without bruits or JVD. Lungs:  Resp regular and unlabored, CTA. Heart: RRR no s3, s4, or murmurs. Abdomen: Soft, non-tender, non-distended, BS + x 4.  Extremities: No clubbing, cyanosis. Trace BL LE edema. DP/PT/Radials 2+ and equal bilaterally.  Accessory Clinical Findings  CBC  Recent Labs  03/21/15 0344 03/22/15 0605  WBC 6.5 7.3  HGB 13.1 13.6  HCT 39.6 42.0  MCV 88.2 89.2  PLT 207 241   Basic Metabolic Panel  Recent Labs  03/20/15 0440  NA 136  K 4.0  CL 100*  CO2 28  GLUCOSE 100*  BUN 8  CREATININE 0.73  CALCIUM 9.1     Recent Labs  03/19/15 1520  CKMB 0    TELE  NSR  Radiology/Studies  Ct Abdomen Pelvis Wo Contrast  03/18/2015   CLINICAL DATA:  Right flank pain, status post PCI with aspirin and Brilinta, on IV heparin for PE, evaluate for retroperitoneal bleed  EXAM: CT ABDOMEN AND PELVIS WITHOUT CONTRAST  TECHNIQUE: Multidetector CT imaging of the abdomen and pelvis was performed following the standard protocol without IV contrast.  COMPARISON:  None.  FINDINGS: Lower chest:  New lung  bases are clear.  Hepatobiliary: Severe hepatic steatosis with focal fatty sparing.  Gallbladder is unremarkable. No intrahepatic or extrahepatic ductal dilatation.  Pancreas: Within normal limits.  Spleen: Within normal limits.  Adrenals/Urinary Tract: Adrenal glands are within normal limits.  Kidneys are within normal limits. No renal calculi or hydronephrosis  Bladder is within normal limits  Stomach/Bowel: Stomach is within normal limits.  No evidence of bowel obstruction.  Normal appendix  Vascular/Lymphatic: Atherosclerotic calcifications of the abdominal aorta and branch vessels.  2.7 cm right common iliac artery aneurysm (series 2/image 68).  Small upper abdominal/ retroperitoneal lymph nodes, within normal limits.  Reproductive: Hyperdensity within the right peripheral gland of the prostate, possibly calcifications/hemorrhage  related to prior biopsy or prostatitis (series 2/image 93).  Other: No abdominopelvic ascites.  No evidence of retroperitoneal hematoma.  Musculoskeletal: Degenerative changes of the visualized thoracolumbar spine, most prominent at L5-S1.  IMPRESSION: No evidence of retroperitoneal hematoma.  No renal, ureteral, or bladder calculi.  No hydronephrosis.  No evidence of bowel obstruction.  Normal appendix.  Hepatic steatosis.  2.7 cm right common iliac artery aneurysm.   Electronically Signed   By: Charline Bills M.D.   On: 03/18/2015 12:23   Dg Chest 2 View  03/13/2015   CLINICAL DATA:  Chest pain, difficulty breathing.  EXAM: CHEST  2 VIEW  COMPARISON:  March 08, 2015.  FINDINGS: The heart size and mediastinal contours are within normal limits. Both lungs are clear. No pneumothorax or pleural effusion is noted. Anterior osteophyte formation is noted in the lower thoracic spine.  IMPRESSION: No active cardiopulmonary disease.   Electronically Signed   By: Lupita Raider, M.D.   On: 03/13/2015 13:54   Dg Chest 2 View  03/08/2015   CLINICAL DATA:  LEFT-sided chest pain began 1 day ago. Shortness of breath intermittently. Ex smoker.  EXAM: CHEST  2 VIEW  COMPARISON:  None.  FINDINGS: Hyperinflation suggesting COPD. No nodule, infiltrate, or edema. Normal cardiomediastinal silhouette. Thoracic atherosclerosis. Degenerative change in the thoracic spine.  IMPRESSION: No active cardiopulmonary disease.COPD.   Electronically Signed   By: Elsie Stain M.D.   On: 03/08/2015 11:23   Ct Angio Chest Pe W/cm &/or Wo Cm  03/13/2015   CLINICAL DATA:  Severe shortness of breath, elevated D-dimer  EXAM: CT ANGIOGRAPHY CHEST WITH CONTRAST  TECHNIQUE: Multidetector CT imaging of the chest was performed using the standard protocol during bolus administration of intravenous contrast. Multiplanar CT image reconstructions and MIPs were obtained to evaluate the vascular anatomy.  CONTRAST:  OMNIPAQUE IOHEXOL 350 MG/ML  SOLN IV  COMPARISON:  None  FINDINGS: Aorta normal caliber without aneurysm or dissection.  Scattered atherosclerotic calcifications aorta, proximal great vessels and coronary arteries.  Pulmonary arteries well opacified.  Multiple filling defects identified within pulmonary arteries bilaterally compatible with extensive pulmonary embolism.  This includes emboli within both upper lobes, RIGHT middle lobe and BILATERAL lower lobes.  Saddle emboli identified at bifurcations of RIGHT pulmonary artery and descending interlobar pulmonary artery.  Elevated RV/LV ratio = 1.11.  Questionable wall thickening of the distal esophagus.  Visualized upper abdomen unremarkable.  No thoracic adenopathy.  11 x 6 mm diameter area of nodular thickening along minor fissure image 49.  Remaining lungs clear.  No pleural effusion or pneumothorax.  Review of the MIP images confirms the above findings.  IMPRESSION: Multiple BILATERAL pulmonary emboli including a saddle embolus at the bifurcation of the RIGHT pulmonary artery.  Positive for acute PE with CT  evidence of right heart strain (RV/LV Ratio = 1.11) consistent with at least submassive (intermediate risk) PE. The presence of right heart strain has been associated with an increased risk of morbidity and mortality. Please activate Code PE by paging [phone].  Findings called to Dr. Mayford Knife on 03/13/2015 at 2146 hr.   Electronically Signed   By: Ulyses Southward M.D.   On: 03/13/2015 21:46    ASSESSMENT AND PLAN   1. Bilateral pulmonary embolism, multiple 03/13/15 (Acute DVT of right tibial vein): - ON IV Heparin with Warfarin (INR 2.08) - using warfarin 2/2 Brilinta. - Breathing much improved. - No evidence of bleed on CT scan yesterday.  - Prn narcotics for pain -- no NSAIDs.  2. History of Unstable Angina --> S/P angioplasty with stent-RCA prox and distal-DES 03/06/15 -->CAD S/P PCI with DES to RCA 03/08/15 - Continue ASA + Brilinta  - Discontinued  statin secondary to  hepatic steatosis and possible statin related myalgias. - BP not high enough to tolerate ACE-I/ARB, however improved today--> can add during TCM appoinment   3. Obesity- Morbid - concern for sleep apnea - CRH & dietary counseling. He was referred to an OP dietician.  - Would benefit from sleep study as an outpatient. He is hoping that this can be delayed until he is able to get insurance issues resolved. - For now use nighttime oxygen  4. 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 - Not on meds currently - follow up with PCP   5. Hyperlipidemia with target LDL less than 70  - significant myalgia symptoms have somewhat resolved having stopped taking statin. Hold statin for now,. Especially in light of severe hepatic steatosis.Marland Kitchen  - CK levels normal. Lovaza added  6. Hepatic steatosis - Likely related to him being severely obese. With him having significant myalgia symptoms, stopped statin. He would likely benefit from GI/hepatology evaluation as an outpatient to determine the severity of his condition.  7. Right iliac artery aneurysm: Should be monitored with outpatient Doppler or CT scan. These aneurysms are not usually treated unless they get beyond 3 cm in diameter.   Signed, Bhagat,Bhavinkumar PA-C   I have seen, examined and evaluated the patient this PM along with Mr. Iver Nestle - [facility]  After reviewing all the available data and chart,  I agree with his findings, examination as well as impression recommendations.  He actually looks great today.  INR no >2 & yesterday was 1.95 - which is essentially 2.   He is now ready for d/c.  Currently on ASA/Plavix & Coumadin to complete 1 month,then can stop ASA.  Will need warfarin check next week @ [facility] office  BP will not likley tolerate more BP meds beyond low dose BB.  He will f/u with Dr. [place].Marland Kitchen    Marykay Lex, M.D.,  M.S. Interventional Cardiologist   Pager # [phone]

## 2015-03-22 NOTE — Progress Notes (Signed)
ANTICOAGULATION CONSULT NOTE - Follow Up Consult  Pharmacy Consult for heparin and warfarin Indication: pulmonary embolus  No Known Allergies  Patient Measurements: Height:  (190.5 cm) Weight: (!) 329 lb 12.8 oz (149.596 kg) IBW/kg (Calculated) : 84.5 Heparin Dosing Weight: 120 kg  Vital Signs: Temp: 98.6 F (37 C) (09/30 0604) Temp Source: Oral (09/30 0604) BP: 143/88 mmHg (09/30 0604) Pulse Rate: 85 (09/30 0604)  Labs:  Recent Labs  03/19/15 1520 03/20/15 0440 03/21/15 0344 03/22/15 0605  HGB  --  12.9* 13.1 13.6  HCT  --  38.7* 39.6 42.0  PLT  --  205 207 241  LABPROT  --  21.6* 22.2* 23.2*  INR  --  1.89* 1.95* 2.08*  HEPARINUNFRC  --  0.43 0.30 0.25*  CREATININE  --  0.73  --   --   CKMB 0  --   --   --     Estimated Creatinine Clearance: 153.5 mL/min (by C-G formula based on Cr of 0.73).   Assessment: 59yoM admitted 03/13/2015 with SOB, fatigue, lower back pain and chest pressure. CT of chest showed bilateral submassive PEs with RV strain. Doppler pos for DVT R posterior tibial & peroneal veins. D-dimer elevated to 6.34. Baseline INR 1.20.  PMH: CAD (DESx2 placed 03/08/15), HLD, DM2  Day 9 heparin/warfarin bridge. Heparin is now subtherapeutic. INR up to 2.08 - slow rise, however with 2.7 cm R common iliac artery aneurysm found on CT abd/pelvis, dosing has been cautious.   He is finally therapeutic on Coumadin but a minimum of 24 hours of overlap with Heparin is recommended for PEs.  No noted bleeding. CBC is stable  Goal of Therapy:  INR 2-3 Heparin level 0.3-0.7 units/ml Monitor platelets by anticoagulation protocol: Yes   Plan:  - Increase Heparin to 2200 units/hr  - Warfarin  x1 today - I am going to schedule this for 12noon so he will get this dose today.  If discharged, recommend 7.5mg  daily starting on Saturday 10/1.  Recommend INR check on Monday 10/3. - Monitor daily HL, INR, CBC and s/sx of bleeding  Estella Husk, Pharm.D., BCPS,  AAHIVP Clinical Pharmacist Phone: [phone] or [phone] 03/22/2015, 9:27 AM

## 2015-03-22 NOTE — Progress Notes (Signed)
Discharge instructions given. Pt verbalized understanding and all questions were answered.  

## 2015-03-22 NOTE — Progress Notes (Signed)
CARDIAC REHAB PHASE I   PRE:  Rate/Rhythm: 78 SR  BP:  Supine:   Sitting: 110/70  Standing:    SaO2: 97%RA  MODE:  Ambulation: 700 ft   POST:  Rate/Rhythm: 89 SR  BP:  Supine:   Sitting: 110/72  Standing:    SaO2: 97%RA [id number] Pt wants to go home. Ambulated 700 ft independently pushing IV pole. He has walker in room that sister gave him in case needed. Slightly SOB but tolerated well. Was able to converse during walk.   Luetta Nutting, RN BSN  03/22/2015 9:47 AM

## 2015-03-22 NOTE — Discharge Summary (Signed)
Discharge Summary   Patient ID: Cory Nolan,  MRN: [id number], DOB/AGE: 60-23-1957 60 y.o.  Admit date: 03/13/2015 Discharge date: 03/22/2015  Primary Care Provider: No primary care provider on file. Primary Cardiologist: Dr. 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  Positive for acute PE with CT evidence of right heart strain (RV/LV Ratio = 1.11) consistent with at least submassive (intermediate risk) PE. The presence of right heart strain has been associated with an increased risk of morbidity and mortality. Please activate Code PE by paging [phone].   Echocardiogram 03/13/2015 LV EF: 55% -  60%  ------------------------------------------------------------------- Indications:   Pulmonary embolus 415.19.  ------------------------------------------------------------------- History:  PMH:  Chest pain. Dyspnea. Coronary artery disease.  ------------------------------------------------------------------- Study Conclusions  - Left ventricle: The cavity size was normal. There was mild concentric hypertrophy. Systolic function was normal. The estimated ejection fraction was in the range of 55% to 60%. Wall motion was normal; there were no  regional wall motion abnormalities. - Aortic valve: Trileaflet; normal thickness, mildly calcified leaflets. - Aorta: Aortic root dimension: 40 mm (ED). - Aortic root: The aortic root was mildly dilated. - Right ventricle: Poor acoustical windows reduces accuracy of RV assessment. The RV is upper limits of normal to mildly dilated. Unable to assess RVF accurately due to poor windows. The cavity size was mildly dilated. Wall thickness was normal. - Pulmonary arteries: PA peak pressure: 48 mm Hg (S).  Impressions:  - The right ventricular systolic pressure was increased consistent with moderate pulmonary hypertension.   LE venous doppler 03/15/2015  Summary:  - No evidence of deep venous thrombosis in left leg. Evidence of deep vein thrombosis in right posterior tibial vein. - . 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 - No evidence of Baker&'s cyst on the left.    CT of abdomen and pelvis 03/18/2015  IMPRESSION: No evidence of retroperitoneal hematoma.  No renal, ureteral, or bladder calculi. No hydronephrosis.  No evidence of bowel obstruction. Normal appendix.  Hepatic steatosis.  2.7 cm right common iliac artery aneurysm.    Hospital Course  The patient is a 59 year old male with past medical history of morbid obesity, hyperlipidemia, DM 2 and CAD status post DES to mid to distal RCA on 03/08/2015. He was discharged on aspirin, Brilinta and high-dose statin. He presented back to [facility] on 03/13/2015 with complaint of shortness of breath, fatigue, lower back pain and chest pressure. He also endorsed some diaphoresis. He was noted to be tachycardic along with dyspnea. D-dimer was checked which came back elevated at 6.34. CTA of the chest obtained showed multiple bilateral pulmonary emboli including saddle embolus at the bifurcation of right pulmonary artery. Code PE was activated, however patient did not meet  criteria for  lytic at this time. IV heparin was started. Limited echocardiogram was obtained on 9/21 as well, EF is 55-60%, no regional wall motion abnormality, mildly dilated aortic root at 40 mm, poor caustic window reduces accuracy of RV assessment, RV is upper limits of normal to mildly dilated, PA peak pressure 48. Lower extremity venous Doppler showed evidence of DVT in the right posterior tibial vein. Patient was started on Coumadin bridging with Heparin. 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 After discussing with Dr. Allyson Sabal, this should be followed up as outpatient via either Doppler or CT scan, and it is usually not treated unless it is beyond 3 cm in diameter. Given hepatic steatosis, statin was discontinued. It is likely related to him being severely obese. He may benefit from GI/hepatology evaluation as outpatient to determine the severity of his condition.  He was seen in the morning 03/22/2015, his INR is 2.08. He is cleared by Dr. Herbie Baltimore for discharge. He will continue on aspirin, Brilinta and Coumadin. Later, aspirin may be discontinued after at least 1 month following stent placement. Patient has been referred to outpatient dietitian. He may also benefit from sleep study as outpatient, although patient is hoping this can be delayed until he is able to get insurance issues resolved. Lovaza was added, statin was discontinued given hepatic issue and myalgia. It was noted during this admission, he was diagnosed with prediabetes with hemoglobin A1c of 6.2. He will need better outpatient management. Case manager has arranged PCP follow-up. I have arranged 7 transition of transition of care follow-up. Brilinta assistance form has been filled out by M.D. Our scheduler who will contact the patient to arrange new coumadin clinic visit. I have called our pharmacist who recommend discharge patient on  daily of coumadin and have him followup with coumadin  clinic early next week. I have instructed the patient that it is imperative for him to give [place] call if he does not hear from our scheduler regarding Coumadin clinic visit and it is dangerous to continue Coumadin without Coumadin level checked. He will contact cardiology service if have any bleeding issues.   Discharge Vitals Blood pressure 103/66, pulse 73, temperature 98.1 F (36.7 C), temperature source Oral, resp. rate 18, height  (1.905 m), weight 329 lb 12.8 oz (149.596 kg), SpO2 96 %.  Filed Weights   03/13/15 2345 03/16/15 1400 03/20/15 2039  Weight: 330 lb 1.6 oz (149.732 kg) 331 lb 3.2 oz (150.231 kg) 329 lb 12.8 oz (149.596 kg)    Labs  CBC  Recent Labs  03/21/15 0344 03/22/15 0605  WBC 6.5 7.3  HGB 13.1 13.6  HCT 39.6 42.0  MCV 88.2 89.2  PLT 207 241   Basic Metabolic Panel  Recent Labs  03/20/15 0440  NA 136  K 4.0  CL 100*  CO2 28  GLUCOSE 100*  BUN 8  CREATININE 0.73  CALCIUM 9.1   Cardiac Enzymes No results for input(s): CKTOTAL, CKMB, CKMBINDEX, TROPONINI in the last 72 hours.  Disposition  Pt is being discharged home today in good condition.  Follow-up Plans & Appointments      Follow-up Information    Follow up with [facility].   Why:  rolling walker   Contact information:   [address] [place] [address] [phone]       Follow up with [facility]     On 03/25/2015.  Why:  12pm. take with you picture ID, $20 copay and list of discharge medications   Contact information:   [address] [place] [address] [phone]      Follow up with Nicolasa Ducking, NP On 03/27/2015.   Specialties:  Nurse Practitioner, Cardiology, Radiology   Why:  9:00am   Contact information:   [address] [place] [address] [phone]       Follow up with [facility].   Why:  Coumadin clinic followup, office will contact you to set up  coumadin clinic visits, please give [place] a call if you do not hear from [place] in 2 business days   Contact information:   [address] [place] [address] [phone]      Discharge Medications    Medication List    STOP taking these medications        ALEVE 220 MG tablet  Generic drug:  naproxen sodium     atorvastatin 80 MG tablet  Commonly known as:  LIPITOR      TAKE these medications        ANTI MONKEY BUTT EX  Apply 1 application topically daily as needed (friction burns on upper legs).     aspirin EC 81 MG tablet  Take 1 tablet (81 mg total) by mouth daily.     GLUCOSAMINE PO  Take 1 capsule by mouth daily.     hydrocortisone cream 1 %  Apply 1 application topically daily as needed for itching (eczema on feet).     metoprolol tartrate 25 MG tablet  Commonly known as:  LOPRESSOR  Take 0.5 tablets (12.5 mg total) by mouth 2 (two) times daily.     nitroGLYCERIN 0.4 MG SL tablet  Commonly known as:  NITROSTAT  Place 1 tablet (0.4 mg total) under the tongue every 5 (five) minutes x 3 doses as needed for chest pain.     omega-3 acid ethyl esters 1 G capsule  Commonly known as:  LOVAZA  Take 1 capsule (1 g total) by mouth 2 (two) times daily.     PRESCRIPTION MEDICATION  Place 3-4 drops into the left eye 5 (five) times daily as needed (dry eyes). Lubricating eye drop from [facility] (samples)     ranitidine 150 MG tablet  Commonly known as:  ZANTAC  Take 150-300 mg by mouth daily as needed for heartburn (also take with naproxen).     ticagrelor 90 MG Tabs tablet  Commonly known as:  BRILINTA  Take 1 tablet (90 mg total) by mouth 2 (two) times daily.     warfarin 5 MG tablet  Commonly known as:  COUMADIN  Take 2 tablets (10 mg total) by mouth daily.        Outstanding Labs/Studies  Scheduler will contact to arrange coumadin clinic visit  Duration of Discharge Encounter   Greater than 30 minutes including physician  time.  Ramond Dial PA-C Pager: [phone] 03/22/2015, 4:11 PM   I have seen, examined and evaluated the patient this PM along with Mr. Iver Nestle - [facility]. After reviewing all the available data and chart, I agree with his findings, examination as well as impression recommendations.  He actually looks great today. INR no >2 & yesterday was 1.95 - which is essentially 2.  He is now ready for d/c.  Currently on ASA/Plavix & Coumadin to complete 1 month,then can stop ASA.  Will need warfarin check next week @ [facility]  office  BP will not likley tolerate more BP meds beyond low dose BB.  He will f/u with Dr. Swaziland.Marland Kitchen   Marykay Lex, M.D., M.S. Interventional Cardiologist   Pager # [phone]

## 2015-03-22 NOTE — Telephone Encounter (Signed)
New problem   Pt has 7day TCM w/Chris Berge on 10.5.16 per [facility] calling.[email or web address]

## 2015-03-25 ENCOUNTER — Encounter: Payer: Self-pay | Admitting: Family Medicine

## 2015-03-25 ENCOUNTER — Ambulatory Visit: Payer: Self-pay | Attending: Family Medicine | Admitting: Family Medicine

## 2015-03-25 VITALS — BP 121/83 | HR 83 | Temp 98.2°F | Ht 75.0 in | Wt 327.6 lb

## 2015-03-25 DIAGNOSIS — Z95820 Peripheral vascular angioplasty status with implants and grafts: Secondary | ICD-10-CM

## 2015-03-25 DIAGNOSIS — I82441 Acute embolism and thrombosis of right tibial vein: Secondary | ICD-10-CM

## 2015-03-25 DIAGNOSIS — I723 Aneurysm of iliac artery: Secondary | ICD-10-CM

## 2015-03-25 DIAGNOSIS — I2699 Other pulmonary embolism without acute cor pulmonale: Secondary | ICD-10-CM

## 2015-03-25 DIAGNOSIS — Z9861 Coronary angioplasty status: Secondary | ICD-10-CM

## 2015-03-25 DIAGNOSIS — I2510 Atherosclerotic heart disease of native coronary artery without angina pectoris: Secondary | ICD-10-CM

## 2015-03-25 DIAGNOSIS — Z959 Presence of cardiac and vascular implant and graft, unspecified: Secondary | ICD-10-CM

## 2015-03-25 LAB — POCT INR: INR: 4.4

## 2015-03-25 MED ORDER — TICAGRELOR 90 MG PO TABS: 90.0000 mg | ORAL_TABLET | Freq: Two times a day (BID) | ORAL | Status: DC

## 2015-03-25 NOTE — Patient Instructions (Addendum)
HOLD COUMADIN FOR TWO DAYS UNTIL APPOINTMENT WITH COUMADIN CLINIC ON 03/27/15    Pulmonary Embolism A pulmonary (lung) embolism (PE) is a blood clot that has traveled to the lung and results in a blockage of blood flow in the affected lung. Most clots come from deep veins in the legs or pelvis. PE is a dangerous and potentially life-threatening condition that can be treated if identified. CAUSES Blood clots form in a vein for different reasons. Usually several things cause blood clots. They include:  The flow of blood slows down.  The inside of the vein is damaged in some way.  The person has a condition that makes the blood clot more easily. RISK FACTORS Some people are more likely than others to develop PE. Risk factors include:   Smoking.  Being overweight (obese).  Sitting or lying still for a long time. This includes long-distance travel, paralysis, or recovery from an illness or surgery. Other factors that increase risk are:   Older age, especially over 40 years of age.  Having a family history of blood clots or if you have already had a blood clot.  Having major or lengthy surgery. This is especially true for surgery on the hip, knee, or belly (abdomen). Hip surgery is particularly high risk.  Having a long, thin tube (catheter) placed inside a vein during a medical procedure.  Breaking a hip or leg.  Having cancer or cancer treatment.  Medicines containing the male hormone estrogen. This includes birth control pills and hormone replacement therapy.  Other circulation or heart problems.  Pregnancy and childbirth.  Hormone changes make the blood clot more easily during pregnancy.  The fetus puts pressure on the veins of the pelvis.  There is a risk of injury to veins during delivery or a caesarean delivery. The risk is highest just after childbirth.  PREVENTION   Exercise the legs regularly. Take a brisk 30 minute walk every day.  Maintain a weight that is  appropriate for your height.  Avoid sitting or lying in bed for long periods of time without moving your legs.  Women, particularly those over the age of 35 years, should consider the risks and benefits of taking estrogen medicines, including birth control pills.  Do not smoke, especially if you take estrogen medicines.  Long-distance travel can increase your risk. You should exercise your legs by walking or pumping the muscles every hour.  Many of the risk factors above relate to situations that exist with hospitalization, either for illness, injury, or elective surgery. Prevention may include medical and nonmedical measures.   Your health care provider will assess you for the need for venous thromboembolism prevention when you are admitted to the hospital. If you are having surgery, your surgeon will assess you the day of or day after surgery.  SYMPTOMS  The symptoms of a PE usually start suddenly and include:  Shortness of breath.  Coughing.  Coughing up blood or blood-tinged mucus.  Chest pain. Pain is often worse with deep breaths.  Rapid heartbeat. DIAGNOSIS  If a PE is suspected, your health care provider will take a medical history and perform a physical exam. Other tests that may be required include:  Blood tests, such as studies of the clotting properties of your blood.  Imaging tests, such as ultrasound, CT, MRI, and other tests to see if you have clots in your legs or lungs.  An electrocardiogram. This can look for heart strain from blood clots in the lungs. TREATMENT  The most common treatment for a PE is blood thinning (anticoagulant) medicine, which reduces the blood's tendency to clot. Anticoagulants can stop new blood clots from forming and old clots from growing. They cannot dissolve existing clots. Your body does this by itself over time. Anticoagulants can be given by mouth, through an intravenous (IV) tube, or by injection. Your health care provider will  determine the best program for you.  Less commonly, clot-dissolving medicines (thrombolytics) are used to dissolve a PE. They carry a high risk of bleeding, so they are used mainly in severe cases.  Very rarely, a blood clot in the leg needs to be removed surgically.  If you are unable to take anticoagulants, your health care provider may arrange for you to have a filter placed in a main vein in your abdomen. This filter prevents clots from traveling to your lungs. HOME CARE INSTRUCTIONS   Take all medicines as directed by your health care provider.  Learn as much as you can about DVT.  Wear a medical alert bracelet or carry a medical alert card.  Ask your health care provider how soon you can go back to normal activities. It is important to stay active to prevent blood clots. If you are on anticoagulant medicine, avoid contact sports.  It is very important to exercise. This is especially important while traveling, sitting, or standing for long periods of time. Exercise your legs by walking or by tightening and relaxing your leg muscles regularly. Take frequent walks.  You may need to wear compression stockings. These are tight elastic stockings that apply pressure to the lower legs. This pressure can help keep the blood in the legs from clotting. Taking Warfarin Warfarin is a daily medicine that is taken by mouth. Your health care provider will advise you on the length of treatment (usually 3-6 months, sometimes lifelong). If you take warfarin:  Understand how to take warfarin and foods that can affect how warfarin works in Public relations account executive.  Too much and too little warfarin are both dangerous. Too much warfarin increases the risk of bleeding. Too little warfarin continues to allow the risk for blood clots. Warfarin and Regular Blood Testing While taking warfarin, you will need to have regular blood tests to measure your blood clotting time. These blood tests usually include both the prothrombin  time (PT) and international normalized ratio (INR) tests. The PT and INR results allow your health care provider to adjust your dose of warfarin. It is very important that you have your PT and INR tested as often as directed by your health care provider.  Warfarin and Your Diet Avoid major changes in your diet, or notify your health care provider before changing your diet. Arrange a visit with a registered dietitian to answer your questions. Many foods, especially foods high in vitamin K, can interfere with warfarin and affect the PT and INR results. You should eat a consistent amount of foods high in vitamin K. Foods high in vitamin K include:   Spinach, kale, broccoli, cabbage, collard and turnip greens, Brussels sprouts, peas, cauliflower, seaweed, and parsley.  Beef and pork liver.  Green tea.  Soybean oil. Warfarin with Other Medicines Many medicines can interfere with warfarin and affect the PT and INR results. You must:  Tell your health care provider about any and all medicines, vitamins, and supplements you take, including aspirin and other over-the-counter anti-inflammatory medicines. Be especially cautious with aspirin and anti-inflammatory medicines. Ask your health care provider before taking these.  Do not take or discontinue any prescribed or over-the-counter medicine except on the advice of your health care provider or pharmacist. Warfarin Side Effects Warfarin can have side effects, such as easy bruising and difficulty stopping bleeding. Ask your health care provider or pharmacist about other side effects of warfarin. You will need to:  Hold pressure over cuts for longer than usual.  Notify your dentist and other health care providers that you are taking warfarin before you undergo any procedures where bleeding may occur. Warfarin with Alcohol and Tobacco   Drinking alcohol frequently can increase the effect of warfarin, leading to excess bleeding. It is best to avoid  alcoholic drinks or consume only very small amounts while taking warfarin. Notify your health care provider if you change your alcohol intake.  Do not use any tobacco products including cigarettes, chewing tobacco, or electronic cigarettes. If you smoke, quit. Ask your health care provider for help with quitting smoking. Alternative Medicines to Warfarin: Factor Xa Inhibitor Medicines  These blood thinning medicines are taken by mouth, usually for several weeks or longer. It is important to take the medicine every single day, at the same time each day.  There are no regular blood tests required when using these medicines.  There are fewer food and drug interactions than with warfarin.  The side effects of this class of medicine is similar to that of warfarin, including excessive bruising or bleeding. Ask your health care provider or pharmacist about other potential side effects. SEEK MEDICAL CARE IF:   You notice a rapid heartbeat.  You feel weaker or more tired than usual.  You feel faint.  You notice increased bruising.  Your symptoms are not getting better in the time expected.  You are having side effects of medicine. SEEK IMMEDIATE MEDICAL CARE IF:   You have chest pain.  You have trouble breathing.  You have new or increased swelling or pain in one leg.  You cough up blood.  You notice blood in vomit, in a bowel movement, or in urine.  You have a fever. Symptoms of PE may represent a serious problem that is an emergency. Do not wait to see if the symptoms will go away. Get medical help right away. Call your local emergency services (911 in the [place]). Do not drive yourself to the hospital. Document Released: 06/05/2000 Document Revised: 10/23/2013 Document Reviewed: 06/19/2013 [facility] Patient Information 2015 [place], [place]. This information is not intended to replace advice given to you by your health care provider. Make sure you discuss any questions you  have with your health care provider.

## 2015-03-25 NOTE — Progress Notes (Signed)
Patient being seen for a hospital follow up.Went in on the 16 th for blockage, 3 stints put in. Oct 27 th went in for not being able to breath.  Had 2 PE and clot in right leg. Has an appt for Coumadin check on Wed. No pain or discomfort but is aware he has had some heart problems.

## 2015-03-25 NOTE — Telephone Encounter (Signed)
This is Dr. Jordan's pt. °

## 2015-03-25 NOTE — Progress Notes (Signed)
CC: Hospital follow-up.  HPI: Cory Nolan is a 59 y.o. male here today for a follow up visit from hospitalization between 03/13/15 and 03/22/15; Medical history is significant for obesity, coronary artery disease status post DES stent x2  (to the proximal and mid to distal RCA on 03/08/15).  He had presented to the ED with shortness of breath, tachycardia on exam and a d-dimer came back elevated at 6.34, CT angiogram of the chest revealed multiple bilateral pulmonary emboli including saddle embolus at the bifurcation of the right pulmonary artery. IV heparin was commenced; lower extremity Dopplers revealed presence of DVT in the right posterior tibial vein and he was commenced on Coumadin bridging with heparin. CT abdomen and pelvis performed revealed a 2.7 cm right common iliac artery aneurysm which was evaluated by vascular and outpatient follow-up was suggested as treatment was not indicated unless greater than 3 cm. Limited echocardiogram was obtained which revealed EF 55-60%, no regional wall motion abnormality, mildly dilated aortic root at 40 mm, poor caustic window reduces accuracy of RV assessment, RV was upper limits of normal to mildly dilated, PA peak pressure 48.  An A1C came back at 6.2 with a diagnosis of pre Diabetes; his statin was discontinued due to myalgias and the presence of hepatic steatosis and CT abdomen. At the time of discharge his INR was 2.1 and he was advised to follow-up with the Coumadin clinic and [facility].  Interval history: He reports compliance with medications and is walking 0.25 miles every day. He is determined to lose weight and is working on a healthier diet. Patient has No headache, No chest pain, No abdominal pain - No Nausea, No new weakness tingling or numbness, No Cough - SOB.  No Known Allergies Past Medical History  Diagnosis Date  . Coronary artery disease 03/08/15  . Morbid obesity (HCC)   . Dyslipidemia   . Diabetes mellitus type 2 in obese  (HCC)   . Bilateral pulmonary embolism, multiple 03/13/15 03/08/15  . S/P angioplasty with stent-RCA prox and distal-DES 03/06/15 03/08/15  . Diarrhea 03/15/2015   Current Outpatient Prescriptions on File Prior to Visit  Medication Sig Dispense Refill  . aspirin EC 81 MG tablet Take 1 tablet (81 mg total) by mouth daily.    . metoprolol tartrate (LOPRESSOR) 25 MG tablet Take 0.5 tablets (12.5 mg total) by mouth 2 (two) times daily. 90 tablet 3  . nitroGLYCERIN (NITROSTAT) 0.4 MG SL tablet Place 1 tablet (0.4 mg total) under the tongue every 5 (five) minutes x 3 doses as needed for chest pain. 25 tablet 3  . omega-3 acid ethyl esters (LOVAZA) 1 G capsule Take 1 capsule (1 g total) by mouth 2 (two) times daily. 30 capsule 11  . Powders (ANTI MONKEY BUTT EX) Apply 1 application topically daily as needed (friction burns on upper legs).    Marland Kitchen PRESCRIPTION MEDICATION Place 3-4 drops into the left eye 5 (five) times daily as needed (dry eyes). Lubricating eye drop from [facility] (samples)    . ticagrelor (BRILINTA) 90 MG TABS tablet Take 1 tablet (90 mg total) by mouth 2 (two) times daily. 60 tablet 0  . warfarin (COUMADIN) 5 MG tablet Take 2 tablets (10 mg total) by mouth daily. 60 tablet 3  . Glucosamine HCl (GLUCOSAMINE PO) Take 1 capsule by mouth daily.    . hydrocortisone cream 1 % Apply 1 application topically daily as needed for itching (eczema on feet).    . ranitidine (ZANTAC)  150 MG tablet Take 150-300 mg by mouth daily as needed for heartburn (also take with naproxen).      No current facility-administered medications on file prior to visit.   No family history on file. Social History   Social History  . Marital Status: Unknown    Spouse Name: N/A  . Number of Children: N/A  . Years of Education: N/A   Occupational History  . Not on file.   Social History Main Topics  . Smoking status: Former Smoker    Quit date: 01/09/2015  . Smokeless tobacco: Current User    Types: Chew  .  Alcohol Use: Yes     Comment: occ  . Drug Use: Not on file  . Sexual Activity: Not on file   Other Topics Concern  . Not on file   Social History Narrative    Review of Systems: Constitutional: Negative for fever, chills, diaphoresis, activity change, appetite change and fatigue. HENT: Negative for ear pain, nosebleeds, congestion, facial swelling, rhinorrhea, neck pain, neck stiffness and ear discharge.  Eyes: Negative for pain, discharge, redness, itching, left eye visual loss Respiratory: Negative for cough, choking, chest tightness, shortness of breath, wheezing and stridor.  Cardiovascular: Negative for chest pain, palpitations and leg swelling. Gastrointestinal: Negative for abdominal distention. Genitourinary: Negative for dysuria, urgency, frequency, hematuria, flank pain, decreased urine volume, difficulty urinating and dyspareunia.  Musculoskeletal: Negative for back pain, joint swelling, arthralgias and gait problem. Neurological: Negative for dizziness, tremors, seizures, syncope, facial asymmetry, speech difficulty, weakness, light-headedness, numbness and headaches.  Hematological: Negative for adenopathy. Does not bruise/bleed easily. Psychiatric/Behavioral: Negative for hallucinations, behavioral problems, confusion, dysphoric mood, decreased concentration and agitation.    Objective:   Filed Vitals:   03/25/15 1206  BP: 121/83  Pulse: 83  Temp: 98.2 F (36.8 C)    Physical Exam: Constitutional: Patient appears well-developed and well-nourished, morbidly obese. HENT: Normocephalic, atraumatic, External right and left ear normal. Oropharynx is clear and moist.  Eyes: Conjunctivae and EOM are normal. PERRLA, no scleral icterus. Neck: Normal ROM. Neck supple. No JVD. No tracheal deviation. No thyromegaly. CVS: RRR, S1/S2 +, no murmurs, no gallops, no carotid bruit.  Pulmonary: Effort and breath sounds normal, no stridor, rhonchi, wheezes, rales.  Abdominal:  Soft. BS +,  + distension, no tenderness, rebound or guarding.  Musculoskeletal: Normal range of motion. No edema and no tenderness.  Lymphadenopathy: No lymphadenopathy noted, cervical, inguinal or axillary Neuro: Alert. Normal reflexes, muscle tone coordination. No cranial nerve deficit. Skin: Skin is warm and dry. No rash noted. Not diaphoretic. No erythema. No pallor. Psychiatric: Normal mood and affect. Behavior, judgment, thought content normal.  Lab Results  Component Value Date   WBC 7.3 03/22/2015   HGB 13.6 03/22/2015   HCT 42.0 03/22/2015   MCV 89.2 03/22/2015   PLT 241 03/22/2015   Lab Results  Component Value Date   CREATININE 0.73 03/20/2015   BUN 8 03/20/2015   NA 136 03/20/2015   K 4.0 03/20/2015   CL 100* 03/20/2015   CO2 28 03/20/2015    Lab Results  Component Value Date   HGBA1C 6.2* 03/08/2015   Lipid Panel     Component Value Date/Time   CHOL 207* 03/09/2015 0515   TRIG 82 03/09/2015 0515   HDL 32* 03/09/2015 0515   CHOLHDL 6.5 03/09/2015 0515   VLDL 16 03/09/2015 0515   LDLCALC 159* 03/09/2015 0515       Assessment and plan:  Pulmonary Embolism and RLE DVT:  INR is supratherapeutic at 4.4 He already took today's dose this morning. Hold Coumadin for two days until appointment with Coumadin clinic on 03/27/15  Coronary artery disease: Status post DES x2 to RCA I have refilled Brilinta and advised him to stop by the Pharmacy so his medication assistance paperwork can be completed given he has no insurance. As per cardiology notes he is to continue Brilinta and aspirin and probably after a month aspirin will be discontinued as he is also on Coumadin.  Iliac artery aneurysm: No intervention at this time as per vascular as an erythematous less than 3 cm.  Hyperlipidemia: Not on statin due to intolerance-myalgias Low cholesterol diet.  Prediabetes: A1c of 6.2. Educated on [employer], working on lifestyle changes as well as weight loss as he is  morbidly obese  Hypertension: Blood pressure is controlled  This note has been created with Education officer, environmental. Any transcriptional errors are unintentional.         Jaclyn Shaggy, MD. [facility] [phone] 03/25/2015, 12:39 PM

## 2015-03-25 NOTE — Telephone Encounter (Signed)
Patient contacted regarding discharge from [place] on 03-22-15  Patient understands to follow up with provider chris berge np on 03-27-15 at [address] Patient understands discharge instructions? yes Patient understands medications and regiment? yes Patient understands to bring all medications to this visit? yes  His INR today was 4.4. He was told not to take any coumadin for 2 days. He has a follow up with the CVRR clinic Wednesday.

## 2015-03-26 ENCOUNTER — Telehealth: Payer: Self-pay | Admitting: Cardiology

## 2015-03-26 NOTE — Telephone Encounter (Signed)
Called pt and left message informing him that we needed to update his history, before his appointment tomorrow, 03/27/15.

## 2015-03-27 ENCOUNTER — Ambulatory Visit (INDEPENDENT_AMBULATORY_CARE_PROVIDER_SITE_OTHER): Payer: Self-pay | Admitting: Pharmacist

## 2015-03-27 ENCOUNTER — Encounter: Payer: Self-pay | Admitting: Nurse Practitioner

## 2015-03-27 ENCOUNTER — Ambulatory Visit (INDEPENDENT_AMBULATORY_CARE_PROVIDER_SITE_OTHER): Payer: Self-pay | Admitting: Nurse Practitioner

## 2015-03-27 VITALS — BP 110/78 | HR 66 | Resp 18 | Ht 75.0 in | Wt 332.0 lb

## 2015-03-27 DIAGNOSIS — Z7901 Long term (current) use of anticoagulants: Secondary | ICD-10-CM

## 2015-03-27 DIAGNOSIS — Z955 Presence of coronary angioplasty implant and graft: Secondary | ICD-10-CM

## 2015-03-27 DIAGNOSIS — I2699 Other pulmonary embolism without acute cor pulmonale: Secondary | ICD-10-CM

## 2015-03-27 DIAGNOSIS — I259 Chronic ischemic heart disease, unspecified: Secondary | ICD-10-CM

## 2015-03-27 DIAGNOSIS — I82441 Acute embolism and thrombosis of right tibial vein: Secondary | ICD-10-CM

## 2015-03-27 LAB — CBC
HCT: 39.5 % (ref 39.0–52.0)
Hemoglobin: 14 g/dL (ref 13.0–17.0)
MCH: 29.7 pg (ref 26.0–34.0)
MCHC: 35.4 g/dL (ref 30.0–36.0)
MCV: 83.7 fL (ref 78.0–100.0)
MPV: 9.9 fL (ref 8.6–12.4)
Platelets: 285 10*3/uL (ref 150–400)
RBC: 4.72 MIL/uL (ref 4.22–5.81)
RDW: 14 % (ref 11.5–15.5)
WBC: 6.6 10*3/uL (ref 4.0–10.5)

## 2015-03-27 LAB — BASIC METABOLIC PANEL
BUN: 15 mg/dL (ref 7–25)
CO2: 25 mmol/L (ref 20–31)
Calcium: 9.2 mg/dL (ref 8.6–10.3)
Chloride: 100 mmol/L (ref 98–110)
Creat: 0.82 mg/dL (ref 0.70–1.25)
Glucose, Bld: 119 mg/dL — ABNORMAL HIGH (ref 65–99)
Potassium: 4.6 mmol/L (ref 3.5–5.3)
Sodium: 136 mmol/L (ref 135–146)

## 2015-03-27 LAB — POCT INR: INR: 3.7

## 2015-03-27 NOTE — Patient Instructions (Addendum)
We will be checking the following labs today - BMET and CBC   Medication Instructions:    Continue with your current medicines. But  STOP the aspirin after October 16th  Continue the Brilinta and Coumadin    Testing/Procedures To Be Arranged:  N/A  Follow-Up:   See Dr. Swaziland in 6 weeks.     Other Special Instructions:   N/A  Call the [facility] office at [phone] if you have any questions, problems or concerns.

## 2015-03-27 NOTE — Progress Notes (Addendum)
CARDIOLOGY OFFICE NOTE  Date:  03/27/2015    Cory Nolan Date of Birth: 12-Feb-1956 Medical Record #[id number]  PCP:  Jaclyn Shaggy, MD  Cardiologist:  Swaziland    Chief Complaint  Patient presents with  . Coronary Artery Disease    TOC visit -seen for Dr. Swaziland.     History of Present Illness: Cory Nolan is a 59 y.o. male who presents today for a TOC visit. Seen for Dr. Swaziland.   He has a history of morbid obesity, hyperlipidemia, type 2 DM, and CAD status post DES to mid to distal RCA on 03/08/2015. He was discharged on aspirin, Brilinta and high-dose statin.   He presented back to [facility] on 03/13/2015 with complaint of shortness of breath, fatigue, lower back pain and chest pressure. He was noted to be tachycardic along with dyspnea. D-dimer was checked which came back elevated at 6.34. CTA of the chest obtained showed multiple bilateral pulmonary emboli including saddle embolus at the bifurcation of right pulmonary artery. Code PE was activated, however patient did not meet criteria for lytic therapy. IV heparin was started. Limited echocardiogram was obtained on 9/21 as well, EF is 55-60%, no regional wall motion abnormality, mildly dilated aortic root at 40 mm, poor caustic window reduces accuracy of RV assessment, RV is upper limits of normal to mildly dilated, PA peak pressure 48. Lower extremity venous Doppler showed evidence of DVT in the right posterior tibial vein. Patient was started on Coumadin bridging with Heparin. 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 After discussing with Dr. Allyson Sabal, this should be followed up as outpatient via either Doppler or CT scan, and it is usually not treated unless it is beyond 3 cm in diameter. Given hepatic steatosis, statin was discontinued.  He may benefit from GI/hepatology evaluation as outpatient to determine the severity of his condition.  He will continue on aspirin,  Brilinta and Coumadin. Aspirin may be discontinued after at least 1 month following stent placement. He may also benefit from sleep study as outpatient, although patient is hoping this can be delayed until he is able to get insurance issues resolved. Lovaza was added, statin was discontinued given hepatic issue and myalgia. It was noted during this admission, he was diagnosed with prediabetes with hemoglobin A1c of 6.2. He will need better outpatient management. Case manager arranged PCP follow-up.   Comes in today. Here alone. Doing ok. He is feeling better. No more chest pain. Breathing is good. He is doing more walking. No real pain in the right leg. Walked 2 miles "leisurely" yesterday. Tolerating his medicines. No bleeding or bruising. Has seen a PCP and going back today so they can work on getting him Brilinta. He says this will not be cost prohibitive. Trying to get Medicaid or sign up for [facility]. He wants to go back to work at some time. 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 He is trying to eat better. Myalgias are gone with stopping statin.   Past Medical History  Diagnosis Date  . Coronary artery disease 03/08/15  . Morbid obesity (HCC)   . Dyslipidemia   . Diabetes mellitus type 2 in obese (HCC)   . Bilateral pulmonary embolism, multiple 03/13/15 03/08/15  . S/P angioplasty with stent-RCA prox and distal-DES  03/06/15 03/08/15  . Diarrhea 03/15/2015    Past Surgical History  Procedure Laterality Date  . Knee surgery    . Cardiac catheterization N/A 03/08/2015    Procedure: Left Heart Cath and Coronary Angiography;  Surgeon: Peter M Swaziland, MD;  Location: [facility] INVASIVE CV LAB;  Service: Cardiovascular;  Laterality: N/A;     Medications: Current Outpatient Prescriptions  Medication Sig Dispense Refill  . aspirin EC 81 MG tablet Take 1 tablet (81 mg total) by mouth  daily.    . Glucosamine HCl (GLUCOSAMINE PO) Take 1 capsule by mouth daily.    . hydrocortisone cream 1 % Apply 1 application topically daily as needed for itching (eczema on feet).    . metoprolol tartrate (LOPRESSOR) 25 MG tablet Take 0.5 tablets (12.5 mg total) by mouth 2 (two) times daily. 90 tablet 3  . nitroGLYCERIN (NITROSTAT) 0.4 MG SL tablet Place 1 tablet (0.4 mg total) under the tongue every 5 (five) minutes x 3 doses as needed for chest pain. 25 tablet 3  . omega-3 acid ethyl esters (LOVAZA) 1 G capsule Take 1 capsule (1 g total) by mouth 2 (two) times daily. 30 capsule 11  . Powders (ANTI MONKEY BUTT EX) Apply 1 application topically daily as needed (friction burns on upper legs).    Marland Kitchen PRESCRIPTION MEDICATION Place 3-4 drops into the left eye 5 (five) times daily as needed (dry eyes). Lubricating eye drop from [facility] (samples)    . ranitidine (ZANTAC) 150 MG tablet Take 150-300 mg by mouth daily as needed for heartburn (also take with naproxen).     . ticagrelor (BRILINTA) 90 MG TABS tablet Take 1 tablet (90 mg total) by mouth 2 (two) times daily. 180 tablet 3  . warfarin (COUMADIN) 5 MG tablet Take 2 tablets (10 mg total) by mouth daily. 60 tablet 3   No current facility-administered medications for this visit.    Allergies: No Known Allergies  Social History: The patient  reports that he quit smoking about 7 years ago. He has quit using smokeless tobacco. His smokeless tobacco use included Chew. He reports that he drinks alcohol.   Family History: The patient's family history includes Bone cancer in his mother; Breast cancer in his mother; Dementia in his father; Heart attack in his mother; Heart disease in his mother.   Review of Systems: Please see the history of present illness.   Otherwise, the review of systems is positive for none.   All other systems are reviewed and negative.   Physical Exam: VS:  BP 110/78 mmHg  Pulse 66  Resp 18  Ht  (1.905 m)  Wt  332 lb (150.594 kg)  BMI 41.50 kg/m2  SpO2 95% .  BMI Body mass index is 41.5 kg/(m^2).  Wt Readings from Last 3 Encounters:  03/27/15 332 lb (150.594 kg)  03/25/15 327 lb 9.6 oz (148.598 kg)  03/20/15 329 lb 12.8 oz (149.596 kg)    General: Pleasant. He is morbidly obese but in no acute distress. HEENT: Normal but with deformity of the left eye. Neck: Supple, no JVD, carotid bruits, or masses noted.  Cardiac: Regular rate and rhythm. No murmurs, rubs, or gallops. No edema.  Respiratory:  Lungs are clear to auscultation bilaterally with normal work of breathing.  GI: Soft and nontender. Obese.  MS: No deformity or atrophy. Gait and ROM intact. Skin: Warm and dry. Color is normal.  Neuro:  Strength and sensation are intact and no gross focal deficits  noted.  Psych: Alert, appropriate and with normal affect.   LABORATORY DATA:  EKG:  EKG is not ordered today.   Lab Results  Component Value Date   WBC 7.3 03/22/2015   HGB 13.6 03/22/2015   HCT 42.0 03/22/2015   PLT 241 03/22/2015   GLUCOSE 100* 03/20/2015   CHOL 207* 03/09/2015   TRIG 82 03/09/2015   HDL 32* 03/09/2015   LDLCALC 159* 03/09/2015   ALT 45 03/08/2015   AST 28 03/08/2015   NA 136 03/20/2015   K 4.0 03/20/2015   CL 100* 03/20/2015   CREATININE 0.73 03/20/2015   BUN 8 03/20/2015   CO2 28 03/20/2015   INR 3.7 03/27/2015   HGBA1C 6.2* 03/08/2015   Lab Results  Component Value Date   INR 3.7 03/27/2015   INR 4.40 03/25/2015   INR 2.08* 03/22/2015     BNP (last 3 results)  Recent Labs  03/08/15 1035 03/13/15 1335  BNP 5.7 22.2    ProBNP (last 3 results) No results for input(s): PROBNP in the last 8760 hours.   Other Studies Reviewed Today: CTA of chest 03/13/2015  IMPRESSION: Multiple BILATERAL pulmonary emboli including a saddle embolus at the bifurcation of the RIGHT pulmonary artery.  Positive for acute PE with CT evidence of right heart strain (RV/LV Ratio = 1.11) consistent with at  least submassive (intermediate risk) PE. The presence of right heart strain has been associated with an increased risk of morbidity and mortality. Please activate Code PE by paging [phone].   Echocardiogram 03/13/2015 LV EF: 55% -  60%  ------------------------------------------------------------------- Indications:   Pulmonary embolus 415.19.  ------------------------------------------------------------------- History:  PMH:  Chest pain. Dyspnea. Coronary artery disease.  ------------------------------------------------------------------- Study Conclusions  - Left ventricle: The cavity size was normal. There was mild concentric hypertrophy. Systolic function was normal. The estimated ejection fraction was in the range of 55% to 60%. Wall motion was normal; there were no regional wall motion abnormalities. - Aortic valve: Trileaflet; normal thickness, mildly calcified leaflets. - Aorta: Aortic root dimension: 40 mm (ED). - Aortic root: The aortic root was mildly dilated. - Right ventricle: Poor acoustical windows reduces accuracy of RV assessment. The RV is upper limits of normal to mildly dilated. Unable to assess RVF accurately due to poor windows. The cavity size was mildly dilated. Wall thickness was normal. - Pulmonary arteries: PA peak pressure: 48 mm Hg (S).  Impressions:  - The right ventricular systolic pressure was increased consistent with moderate pulmonary hypertension.   LE venous doppler 03/15/2015  Summary:  - No evidence of deep venous thrombosis in left leg. Evidence of deep vein thrombosis in right posterior tibial vein. - . 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 - No evidence of Baker&'s cyst on the left.    CT of abdomen and pelvis 03/18/2015  IMPRESSION: No evidence of retroperitoneal hematoma.  No renal, ureteral, or bladder calculi. No hydronephrosis.  No  evidence of bowel obstruction. Normal appendix.  Hepatic steatosis.  2.7 cm right common iliac artery aneurysm.       Cardiac Cath Conclusion     Prox LAD lesion, 40% stenosed.  The left ventricular systolic function is normal.  Mid RCA to Dist RCA lesion, 90% stenosed. There is a 0% residual stenosis post intervention.  A drug-eluting stent was placed.  Prox RCA lesion, 99% stenosed. There is a 0% residual stenosis post intervention.  A drug-eluting stent was placed.  1. Severe single vessel obstructive CAD  2. Normal LV function 3. Successful stenting of the proximal and distal RCA with DES.  Plan: DAPT for one year. Aggressive risk factor modification.     Assessment/Plan: 1. Bilateral pulmonary embolism, multiple 03/13/15 (Acute DVT of right tibial vein): - he is on coumadin and therapeutic. No problems noted.   2. History of Unstable Angina --> S/P angioplasty with stent-RCA prox and distal-DES 03/06/15 -->CAD S/P PCI with DES to RCA 03/08/15 - Continue ASA + Brilinta but will stop the aspirin after one month of therapy.  - Discontinued statin secondary to hepatic steatosis and possible statin related myalgias - this has improved.   3. Obesity- Morbid - concern for sleep apnea - CRH & dietary counseling. He was referred to an OP dietician.  - Would benefit from sleep study as an outpatient. He is hoping that this can be delayed until he is able to get insurance issues resolved.  4. 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 - Not on meds currently - follow up with PCP   5. Hyperlipidemia with target LDL less than 70  - significant myalgia symptoms have somewhat resolved having stopped taking statin. Hold statin for now,. Especially in light of severe hepatic steatosis.Marland Kitchen  - CK levels normal. Lovaza added  6. Hepatic steatosis - Likely related to him being severely obese.  With him having significant myalgia symptoms, stopped statin. He would likely benefit from GI/hepatology evaluation as an outpatient to determine the severity of his condition - will defer to PCP  7. Right iliac artery aneurysm: Should be monitored with outpatient Doppler or CT scan going forward. These aneurysms are not usually treated unless they get beyond 3 cm in diameter.  He looks good overall. Check baseline lab today. See back in 6 weeks.  He has PCP established at [facility].   Current medicines are reviewed with the patient today.  The patient does not have concerns regarding medicines other than what has been noted above.  The following changes have been made:  See above.  Labs/ tests ordered today include:    Orders Placed This Encounter  Procedures  . Basic metabolic panel  . CBC     Disposition:   FU with Dr. Swaziland in 6 weeks.   Patient is agreeable to this plan and will call if any problems develop in the interim.   Signed: Rosalio Macadamia, RN, ANP-C 03/27/2015 11:39 AM  [facility] [address] [place], [place]  [address] Phone: [phone] Fax: [phone]        Addendum: Cory Nolan was here in the office today - 04/10/2015 - asking about working at [employer] - this is pretty low key and not a physical job - I think this will be fine. He notes that he is depressed - hopefully this will help him in this regards as well.   Rosalio Macadamia, RN, ANP-C [facility] [address] [place], [place]  [address] [phone]

## 2015-03-28 ENCOUNTER — Telehealth: Payer: Self-pay | Admitting: *Deleted

## 2015-03-28 NOTE — Telephone Encounter (Signed)
Pt notified of lab results by phone with verbal understanding.  

## 2015-04-03 ENCOUNTER — Ambulatory Visit (INDEPENDENT_AMBULATORY_CARE_PROVIDER_SITE_OTHER): Payer: Self-pay | Admitting: *Deleted

## 2015-04-03 DIAGNOSIS — I2699 Other pulmonary embolism without acute cor pulmonale: Secondary | ICD-10-CM

## 2015-04-03 DIAGNOSIS — Z7901 Long term (current) use of anticoagulants: Secondary | ICD-10-CM

## 2015-04-03 DIAGNOSIS — I824Z9 Acute embolism and thrombosis of unspecified deep veins of unspecified distal lower extremity: Secondary | ICD-10-CM

## 2015-04-03 DIAGNOSIS — I82441 Acute embolism and thrombosis of right tibial vein: Secondary | ICD-10-CM

## 2015-04-03 LAB — POCT INR: INR: 2.8

## 2015-04-10 ENCOUNTER — Ambulatory Visit (INDEPENDENT_AMBULATORY_CARE_PROVIDER_SITE_OTHER): Payer: Self-pay | Admitting: Pharmacist

## 2015-04-10 DIAGNOSIS — I82441 Acute embolism and thrombosis of right tibial vein: Secondary | ICD-10-CM

## 2015-04-10 DIAGNOSIS — I2699 Other pulmonary embolism without acute cor pulmonale: Secondary | ICD-10-CM

## 2015-04-10 DIAGNOSIS — Z7901 Long term (current) use of anticoagulants: Secondary | ICD-10-CM

## 2015-04-10 LAB — POCT INR: INR: 3.1

## 2015-04-17 ENCOUNTER — Ambulatory Visit (INDEPENDENT_AMBULATORY_CARE_PROVIDER_SITE_OTHER): Payer: Self-pay | Admitting: *Deleted

## 2015-04-17 DIAGNOSIS — I2699 Other pulmonary embolism without acute cor pulmonale: Secondary | ICD-10-CM

## 2015-04-17 DIAGNOSIS — Z7901 Long term (current) use of anticoagulants: Secondary | ICD-10-CM

## 2015-04-17 DIAGNOSIS — I82441 Acute embolism and thrombosis of right tibial vein: Secondary | ICD-10-CM

## 2015-04-17 LAB — POCT INR: INR: 2.6

## 2015-05-01 ENCOUNTER — Ambulatory Visit (INDEPENDENT_AMBULATORY_CARE_PROVIDER_SITE_OTHER): Payer: Self-pay | Admitting: Surgery

## 2015-05-01 DIAGNOSIS — I82441 Acute embolism and thrombosis of right tibial vein: Secondary | ICD-10-CM

## 2015-05-01 DIAGNOSIS — I2699 Other pulmonary embolism without acute cor pulmonale: Secondary | ICD-10-CM

## 2015-05-01 DIAGNOSIS — Z7901 Long term (current) use of anticoagulants: Secondary | ICD-10-CM

## 2015-05-01 LAB — POCT INR: INR: 2.1

## 2015-05-06 ENCOUNTER — Ambulatory Visit: Payer: Self-pay | Admitting: Family Medicine

## 2015-05-13 ENCOUNTER — Telehealth: Payer: Self-pay | Admitting: Cardiology

## 2015-05-13 NOTE — Telephone Encounter (Signed)
[facility] called, wanting to see if we had any samples of Brilinta 90 mg tablets for the pt. I informed them that I would leave a month supply of the Brilinta 90 mg tablets, up at the front dest for the pt to pick up and if the pt has any other problems, questions or concerns to call the office.

## 2015-05-14 ENCOUNTER — Ambulatory Visit (INDEPENDENT_AMBULATORY_CARE_PROVIDER_SITE_OTHER): Payer: Self-pay | Admitting: Physician Assistant

## 2015-05-14 ENCOUNTER — Encounter: Payer: Self-pay | Admitting: Physician Assistant

## 2015-05-14 VITALS — BP 112/78 | HR 64 | Ht 75.0 in | Wt 329.2 lb

## 2015-05-14 DIAGNOSIS — E785 Hyperlipidemia, unspecified: Secondary | ICD-10-CM

## 2015-05-14 DIAGNOSIS — I2510 Atherosclerotic heart disease of native coronary artery without angina pectoris: Secondary | ICD-10-CM

## 2015-05-14 DIAGNOSIS — Z79899 Other long term (current) drug therapy: Secondary | ICD-10-CM

## 2015-05-14 MED ORDER — PRAVASTATIN SODIUM 20 MG PO TABS: 20.0000 mg | ORAL_TABLET | Freq: Every evening | ORAL | Status: DC

## 2015-05-14 MED ORDER — TICAGRELOR 90 MG PO TABS: 90.0000 mg | ORAL_TABLET | Freq: Two times a day (BID) | ORAL | Status: DC

## 2015-05-14 MED ORDER — CLOPIDOGREL BISULFATE 75 MG PO TABS: 75.0000 mg | ORAL_TABLET | Freq: Every day | ORAL | Status: DC

## 2015-05-14 NOTE — Progress Notes (Signed)
Cardiology Office Note   Date:  05/14/2015   ID:  Cory Nolan, DOB 01-Feb-1956, MRN [id number]  PCP:  Jaclyn ShaggyEnobong, Amao, MD  Cardiologist:  Dr SwazilandJordan  Remedios Mckone, PA-C   Chief Complaint  Patient presents with  . Follow-up    2 months post cath/stent;     History of Present Illness: Cory Nolan is a 59 y.o. male with a history of morbid obesity, hyperlipidemia, type 2 DM, and CAD status post DES to mid to distal RCA on 03/08/2015, bilateral PE 03/13/2015 on coumadin. Should no longer be on ASA.    Cory Nolan presents for post hospital follow-up and has concerns about 2 episodes of numbness in his legs.  At his father's funeral service recently, he had onset of numbness in one leg after standing for about 20 minutes. After he sat down and rested a while and then got up and moved around, the numbness resolved. He had a second episode and his other leg again after standing for several minutes. He remembers this episode of more detail and states that the numbness was on the outer part of his right thigh only. It also resolved without intervention.  He has noted that he bleeds much more freely and easily with small cuts. He cannot afford cardiac rehabilitation. He is trying to get more activity and runs beagles, so this will help him. He has made some dietary changes and is trying to make more. He had significant problems with Lipitor and still has occasional back pain that started after he took it. However he is willing to try another statin if we started at a low dose.  The clinic where he goes for his medications is trying to change him to Plavix. He is very reluctant to do this because his father died of bleeding on his brain while he was taking Plavix. His father also had dementia and there may of been a fall involved but this is not clear.   Past Medical History  Diagnosis Date  . Coronary artery disease 03/08/15  . Morbid obesity (HCC)   . Dyslipidemia   . Diabetes  mellitus type 2 in obese (HCC)   . Bilateral pulmonary embolism, multiple 03/13/15 03/08/15  . S/P angioplasty with stent-RCA prox and distal-DES 03/06/15 03/08/15  . Diarrhea 03/15/2015    Past Surgical History  Procedure Laterality Date  . Knee surgery    . Cardiac catheterization N/A 03/08/2015    Procedure: Left Heart Cath and Coronary Angiography;  Surgeon: Peter M SwazilandJordan, MD;  [facility];  Service: Cardiovascular;  Laterality: N/A;    Current Outpatient Prescriptions  Medication Sig Dispense Refill  . Glucosamine HCl (GLUCOSAMINE PO) Take 1 capsule by mouth daily.    . metoprolol tartrate (LOPRESSOR) 25 MG tablet Take 0.5 tablets (12.5 mg total) by mouth 2 (two) times daily. 90 tablet 3  . nitroGLYCERIN (NITROSTAT) 0.4 MG SL tablet Place 1 tablet (0.4 mg total) under the tongue every 5 (five) minutes x 3 doses as needed for chest pain. 25 tablet 3  . omega-3 acid ethyl esters (LOVAZA) 1 G capsule Take 1 capsule (1 g total) by mouth 2 (two) times daily. 30 capsule 11  . Powders (ANTI MONKEY BUTT EX) Apply 1 application topically daily as needed (friction burns on upper legs).    Marland Kitchen. PRESCRIPTION MEDICATION Place 3-4 drops into the left eye 5 (five) times daily as needed (dry eyes). Lubricating [facility] drop from [facility] (samples)    . ranitidine (ZANTAC) 150 MG tablet Take 150-300 mg by mouth daily as needed for heartburn (also take with naproxen).     . ticagrelor (BRILINTA) 90 MG TABS tablet Take 1 tablet (90 mg total) by mouth 2 (two) times daily. 180 tablet 3  . warfarin (COUMADIN) 5 MG tablet Take 2 tablets (10 mg total) by mouth daily. 60 tablet 3   No current facility-administered medications for this visit.    Allergies:   Review of patient's allergies indicates no known allergies.    Social History:  The patient  reports that he quit smoking about 7 years ago. He has quit using smokeless tobacco. His smokeless tobacco use included Chew. He reports that he  drinks alcohol.   Family History:  The patient's family history includes Bone cancer in his mother; Breast cancer in his mother; Dementia in his father; Heart attack in his mother; Heart disease in his mother.    ROS:  Please see the history of present illness. All other systems are reviewed and negative.    PHYSICAL EXAM: VS:  BP 112/78 mmHg  Pulse 64  Ht  (1.905 m)  Wt 329 lb 3 oz (149.318 kg)  BMI 41.15 kg/m2 , BMI Body mass index is 41.15 kg/(m^2). GEN: Well nourished, well developed, male in no acute distress HEENT: normal for age  Neck: no JVD noted but difficult to assess secondary to body habitus, no carotid bruit, no masses Cardiac: RRR; no murmur, no rubs, or gallops Respiratory:  clear to auscultation bilaterally, normal work of breathing GI: soft, nontender, nondistended, + BS MS: no deformity or atrophy; no edema; distal pulses are 2+ in all 4 extremities  Skin: warm and dry, no rash Neuro:  Strength and sensation are intact Psych: euthymic mood, full affect   EKG:  EKG is ordered today. The ekg ordered today demonstrates sinus rhythm, no acute ischemic changes   Recent Labs: 03/08/2015: ALT 45 03/13/2015: B Natriuretic Peptide 22.2 03/27/2015: BUN 15; Creat 0.82; Hemoglobin 14.0; Platelets 285; Potassium 4.6; Sodium 136    Lipid Panel    Component Value Date/Time   CHOL 207* 03/09/2015 0515   TRIG 82 03/09/2015 0515   HDL 32* 03/09/2015 0515   CHOLHDL 6.5 03/09/2015 0515   VLDL 16 03/09/2015 0515   LDLCALC 159* 03/09/2015 0515     Wt Readings from Last 3 Encounters:  05/14/15 329 lb 3 oz (149.318 kg)  03/27/15 332 lb (150.594 kg)  03/25/15 327 lb 9.6 oz (148.598 kg)     Other studies Reviewed: Additional studies/ records that were reviewed today include: Hospital records.  ASSESSMENT AND PLAN:  1.  CAD with DES to the RCA: Compliance with medications was encouraged. We discussed the option of him being on Plavix versus pain for the Brilinta.  He understands that there may have been a fall involved in his father bleeding on the Plavix and will try it. We will give him samples of the Brilinta to last him until he is able to get the Plavix filled (the clinic will have to order). He is encouraged to continue heart-healthy lifestyle modifications. We will check a P2Y12 after he has been on the Plavix several weeks.  2. Hyperlipidemia: He is encouraged to continue dietary modifications which she is doing. He understands that he needs to get his cholesterol down. He is willing to try weaker statin and we will start Pravachol 20. Advised him that even if he could only take  it at a half a tablet orally take it a few times a week, it would still help. He is to let us know how he does on this and if he tolerates it, we will go up to 40. Check liver functions and lipid tests in 6 weeks.   Current medicines are reviewed at length with the patient today.  The patient does not have concerns regarding medicines.  The following changes have been made:  Change Brilinta to Plavix with bridging samples of Brilinta and add Pravachol 20  Labs/ tests ordered today include:    ECG   Disposition:   FU with Dr. Swaziland  Signed, Leanna Battles  05/14/2015 9:26 AM    [facility] [address] [place], [place], [place]  [address] Phone: [phone]; Fax: [phone]   Addendum: After communicating with his primary care, we will continue the Brilinta with samples as they are filing patient assistance forms and believe that it will be obtained. If they are not successful in getting assistance for his Brilinta, he can be switched to Plavix at a later date. For now, he has samples and his primary care is taking care of the assistance forms.  Theodore Demark, Cordelia Poche 05/14/2015 10:40 AM Beeper [phone]

## 2015-05-14 NOTE — Patient Instructions (Addendum)
Your physician has recommended you make the following change in your medication...  1. START pravastatin 20mg  once daily for cholesterol  2. Continue on Brilinta - [facility] is going to work on patient assistance for [employer]  >> 4 bottles of samples = 16 days have been provided  >> take one month free card to [facility] [place]  Your physician recommends that you return for lab work in: 6 weeks - FASTING  Please have the lab work done at [facility] - 1st floor of our office building - [address] [place] labs to check your cholesterol, liver function  Your physician recommends that you schedule a follow-up appointment in: 6 weeks with Dr. SwazilandJordan on 07/09/2015

## 2015-05-29 ENCOUNTER — Ambulatory Visit (INDEPENDENT_AMBULATORY_CARE_PROVIDER_SITE_OTHER): Payer: Self-pay | Admitting: *Deleted

## 2015-05-29 DIAGNOSIS — Z7901 Long term (current) use of anticoagulants: Secondary | ICD-10-CM

## 2015-05-29 DIAGNOSIS — I2699 Other pulmonary embolism without acute cor pulmonale: Secondary | ICD-10-CM

## 2015-05-29 DIAGNOSIS — I82441 Acute embolism and thrombosis of right tibial vein: Secondary | ICD-10-CM

## 2015-05-29 LAB — POCT INR: INR: 2.1

## 2015-06-27 ENCOUNTER — Ambulatory Visit (INDEPENDENT_AMBULATORY_CARE_PROVIDER_SITE_OTHER): Payer: Self-pay | Admitting: *Deleted

## 2015-06-27 DIAGNOSIS — Z7901 Long term (current) use of anticoagulants: Secondary | ICD-10-CM

## 2015-06-27 DIAGNOSIS — I2699 Other pulmonary embolism without acute cor pulmonale: Secondary | ICD-10-CM

## 2015-06-27 DIAGNOSIS — I82441 Acute embolism and thrombosis of right tibial vein: Secondary | ICD-10-CM

## 2015-06-27 LAB — POCT INR: INR: 1.5

## 2015-07-09 ENCOUNTER — Encounter: Payer: Self-pay | Admitting: Cardiology

## 2015-07-09 ENCOUNTER — Ambulatory Visit (INDEPENDENT_AMBULATORY_CARE_PROVIDER_SITE_OTHER): Payer: Self-pay | Admitting: Pharmacist Clinician (PhC)/ Clinical Pharmacy Specialist

## 2015-07-09 ENCOUNTER — Ambulatory Visit (INDEPENDENT_AMBULATORY_CARE_PROVIDER_SITE_OTHER): Payer: Self-pay | Admitting: Cardiology

## 2015-07-09 VITALS — BP 110/80 | HR 68 | Ht 75.0 in | Wt 323.0 lb

## 2015-07-09 DIAGNOSIS — I2699 Other pulmonary embolism without acute cor pulmonale: Secondary | ICD-10-CM

## 2015-07-09 DIAGNOSIS — Z7901 Long term (current) use of anticoagulants: Secondary | ICD-10-CM

## 2015-07-09 DIAGNOSIS — E785 Hyperlipidemia, unspecified: Secondary | ICD-10-CM

## 2015-07-09 DIAGNOSIS — I2510 Atherosclerotic heart disease of native coronary artery without angina pectoris: Secondary | ICD-10-CM

## 2015-07-09 DIAGNOSIS — I723 Aneurysm of iliac artery: Secondary | ICD-10-CM

## 2015-07-09 DIAGNOSIS — Z959 Presence of cardiac and vascular implant and graft, unspecified: Secondary | ICD-10-CM

## 2015-07-09 DIAGNOSIS — I82441 Acute embolism and thrombosis of right tibial vein: Secondary | ICD-10-CM

## 2015-07-09 DIAGNOSIS — Z95820 Peripheral vascular angioplasty status with implants and grafts: Secondary | ICD-10-CM

## 2015-07-09 DIAGNOSIS — Z9861 Coronary angioplasty status: Secondary | ICD-10-CM

## 2015-07-09 LAB — POCT INR: INR: 2.1

## 2015-07-09 NOTE — Patient Instructions (Signed)
Stop taking coumadin in 2 months. Start ASA 81 mg daily then.  We will switch Brilinta to Plavix 75 mg daily.   Continue your other therapy  I will see you in 6 months - we will check lab work then

## 2015-07-09 NOTE — Progress Notes (Signed)
CARDIOLOGY OFFICE NOTE  Date:  07/09/2015    Cory Nolan Date of Birth: 1956/03/19 Medical Record #[id number]  PCP:  Jaclyn Shaggy, MD  Cardiologist:  [place]    Chief Complaint  Patient presents with  . Follow-up    some discomfort, some shortness of breath, no swelling, no cramping, no dizziness or lightheadedness    History of Present Illness: Cory Nolan is a 60 y.o. male who presents today for follow up CAD and PE.   He has a history of morbid obesity, hyperlipidemia, type 2 DM, and CAD status post DES to mid to distal RCA on 03/08/2015. He was discharged on aspirin, Brilinta and high-dose statin.   He presented [facility] to [facility] on [facility]/13/2015 with complaint of shortness of breath, fatigue, lower back pain and chest pressure.  CTA of the chest obtained showed multiple bilateral pulmonary emboli including saddle embolus at the bifurcation of right pulmonary artery.  IV heparin was started. Limited echocardiogram was obtained on 9/21 as well, EF is 55-60%, no regional wall motion abnormality, mildly dilated aortic root at 40 mm, poor caustic window reduces accuracy of RV assessment, RV is upper limits of normal to mildly dilated, PA peak pressure 48. Lower extremity venous Doppler showed evidence of DVT in the right posterior tibial vein. Patient was started on Coumadin bridging with Heparin. 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 After discussing with Dr. Allyson Sabal, this should be followed up as outpatient via either Doppler or CT scan, and it is usually not treated unless it is beyond 3 cm in diameter.  He is intolerant of statins due to myalgias and hepatic steatosis. ASA was discontinued at one month.  On follow up today he is doing well. He denies any chest pain or SOB. Minimal edema. Unable to afford Brilinta and has been getting this from [place]. Notes dyspnea with Brilinta that resolved when he missed it for a couple  of days. On October 26 he had an episode of pain in left leg that lasted 3 hours then resolved. No pain since then.    Past Medical History  Diagnosis Date  . Coronary artery disease 03/08/15  . Morbid obesity (HCC)   . Dyslipidemia   . Diabetes mellitus type 2 in obese (HCC)   . Bilateral pulmonary embolism, multiple 03/13/15 03/08/15  . S/P angioplasty with stent-RCA prox and distal-DES 03/06/15 03/08/15  . Diarrhea 03/15/2015    Past Surgical History  Procedure Laterality Date  . Knee surgery    . Cardiac catheterization N/A 03/08/2015    Procedure: Left Heart Cath and Coronary Angiography;  Surgeon: Kavontae Pritchard M Swaziland, MD;  Location: [facility] INVASIVE CV LAB;  Service: Cardiovascular;  Laterality: N/A;     Medications: Current Outpatient Prescriptions  Medication Sig Dispense Refill  . Glucosamine HCl (GLUCOSAMINE PO) Take 1 capsule by mouth daily.    . metoprolol tartrate (LOPRESSOR) 25 MG tablet Take 0.5 tablets (12.5 mg total) by mouth 2 (two) times daily. 90 tablet 3  . nitroGLYCERIN (NITROSTAT) 0.4 MG SL tablet Place 1 tablet (0.4 mg total) under the tongue every 5 (five) minutes x 3 doses as needed for chest pain. 25 tablet 3  . omega-3 acid ethyl esters (LOVAZA) 1 G capsule Take 1 capsule (1 g total) by mouth 2 (two) times daily. 30 capsule 11  . Powders (ANTI MONKEY BUTT EX) Apply 1 application topically daily as needed (friction burns  on upper legs).    . ranitidine (ZANTAC) 150 MG tablet Take 150-300 mg by mouth daily as needed for heartburn (also take with naproxen).     . ticagrelor (BRILINTA) 90 MG TABS tablet Take 1 tablet (90 mg total) by mouth 2 (two) times daily. 60 tablet 0  . warfarin (COUMADIN) 5 MG tablet Take 2 tablets (10 mg total) by mouth daily. 60 tablet 3  . pravastatin (PRAVACHOL) 20 MG tablet Take 1 tablet (20 mg total) by mouth every evening. (Patient not taking: Reported on 07/09/2015) 90 tablet 3  . PRESCRIPTION MEDICATION Place 3-4 drops into the left eye 5  (five) times daily as needed (dry eyes). Reported on 07/09/2015     No current facility-administered medications for this visit.    Allergies: No Known Allergies  Social History: The patient  reports that he quit smoking about 7 years ago. He has quit using smokeless tobacco. His smokeless tobacco use included Chew. He reports that he drinks alcohol.   Family History: The patient's family history includes Bone cancer in his mother; Breast cancer in his mother; Dementia in his father; Heart attack in his mother; Heart disease in his mother.   Review of Systems: Please see the history of present illness.   Otherwise, the review of systems is positive for none.   All other systems are reviewed and negative.   Physical Exam: VS:  BP 110/80 mmHg  Pulse 68  Ht  (1.905 m)  Wt 146.512 kg (323 lb)  BMI 40.37 kg/m2 .  BMI Body mass index is 40.37 kg/(m^2).  Wt Readings from Last 3 Encounters:  07/09/15 146.512 kg (323 lb)  05/14/15 149.318 kg (329 lb 3 oz)  03/27/15 150.594 kg (332 lb)    General: Pleasant. He is morbidly obese but in no acute distress. HEENT: Normal but with deformity of the left eye. Neck: Supple, no JVD, carotid bruits, or masses noted.  Cardiac: Regular rate and rhythm. No murmurs, rubs, or gallops. No edema.  Respiratory:  Lungs are clear to auscultation bilaterally with normal work of breathing.  GI: Soft and nontender. Obese.  MS: No deformity or atrophy. Gait and ROM intact. Skin: Warm and dry. Color is normal.  Neuro:  Strength and sensation are intact and no gross focal deficits noted.  Psych: Alert, appropriate and with normal affect.   LABORATORY DATA:  EKG:  EKG is not ordered today.   Lab Results  Component Value Date   WBC 6.6 03/27/2015   HGB 14.0 03/27/2015   HCT 39.5 03/27/2015   PLT 285 03/27/2015   GLUCOSE 119* 03/27/2015   CHOL 207* 03/09/2015   TRIG 82 03/09/2015   HDL 32* 03/09/2015   LDLCALC 159* 03/09/2015   ALT 45  03/08/2015   AST 28 03/08/2015   NA 136 03/27/2015   K 4.6 03/27/2015   CL 100 03/27/2015   CREATININE 0.82 03/27/2015   BUN 15 03/27/2015   CO2 25 03/27/2015   INR 2.1 07/09/2015   HGBA1C 6.2* 03/08/2015   Lab Results  Component Value Date   INR 2.1 07/09/2015   INR 1.5 06/27/2015   INR 2.1 05/29/2015     BNP (last 3 results)  Recent Labs  03/08/15 1035 03/13/15 1335  BNP 5.7 22.2    ProBNP (last 3 results) No results for input(s): PROBNP in the last 8760 hours.   Other Studies Reviewed Today: none      Assessment/Plan: 1. Bilateral pulmonary embolism, multiple 03/13/15 (Acute  DVT of right tibial vein): - he is on coumadin and therapeutic. No problems noted. Recommend taking coumadin for a total of 6 months then discontinue. May resume ASA 81 mg daily when coumadin stopped.  2. History of Unstable Angina --> S/P angioplasty with stent-RCA prox and distal-DES 03/06/15 -->CAD S/P PCI with DES to RCA 03/08/15 - Given side effects on Brilinta and cost will switch to Plavix 75 mg daily - Discontinued statin secondary to hepatic steatosis and  statin related myalgias   3. Obesity- Morbid - concern for sleep apnea - [facility] & dietary counseling.  - Would benefit from sleep study as an outpatient. He cannot currently afford without insurance.  4.  Hyperlipidemia with target LDL less than 70  - significant myalgia symptoms have resolved having stopped taking statin. Hold statin for now,. Especially in light of severe hepatic steatosis.Marland Kitchen  - not a candidate for Zetia due to cost.   5. Hepatic steatosis - Likely related to him being severely obese. With him having significant myalgia symptoms, stopped statin.   6. Right iliac artery aneurysm: Should be monitored with outpatient Doppler or CT scan in one year. These aneurysms are not usually treated unless they get beyond 3 cm in diameter.   Current medicines are reviewed with the patient today.  The patient  does not have concerns regarding medicines other than what has been noted above.  The following changes have been made:  See above.  Labs/ tests ordered today include:    Orders Placed This Encounter  Procedures  . Basic metabolic panel  . Lipid panel  . Hepatic function panel     Disposition:   FU with Dr. Swaziland in 6 months.   Patient is agreeable to this plan and will call if any problems develop in the interim.   Signed: Luisenrique Conran Swaziland MD, [facility]    07/09/2015 12:38 PM  [facility]

## 2015-07-19 MED FILL — ?WARFARIN SODIUM 5 MG TABLE: 5 | 30 days supply | Qty: 60 | Fill #2

## 2015-07-19 MED FILL — METOPROLOL TARTRATE 25 MG T: 25 | 30 days supply | Qty: 30 | Fill #1

## 2015-07-19 MED FILL — CLOPIDOGREL 75 MG TABLET: 75 | 30 days supply | Qty: 30 | Fill #0

## 2015-07-30 ENCOUNTER — Ambulatory Visit (INDEPENDENT_AMBULATORY_CARE_PROVIDER_SITE_OTHER): Payer: Self-pay | Admitting: *Deleted

## 2015-07-30 DIAGNOSIS — I2699 Other pulmonary embolism without acute cor pulmonale: Secondary | ICD-10-CM

## 2015-07-30 DIAGNOSIS — Z7901 Long term (current) use of anticoagulants: Secondary | ICD-10-CM

## 2015-07-30 DIAGNOSIS — I82441 Acute embolism and thrombosis of right tibial vein: Secondary | ICD-10-CM

## 2015-07-30 LAB — POCT INR: INR: 1.6

## 2015-08-13 ENCOUNTER — Ambulatory Visit (INDEPENDENT_AMBULATORY_CARE_PROVIDER_SITE_OTHER): Payer: Self-pay | Admitting: *Deleted

## 2015-08-13 DIAGNOSIS — Z7901 Long term (current) use of anticoagulants: Secondary | ICD-10-CM

## 2015-08-13 DIAGNOSIS — I2699 Other pulmonary embolism without acute cor pulmonale: Secondary | ICD-10-CM

## 2015-08-13 DIAGNOSIS — I82441 Acute embolism and thrombosis of right tibial vein: Secondary | ICD-10-CM

## 2015-08-13 LAB — POCT INR: INR: 1.9

## 2015-08-15 ENCOUNTER — Other Ambulatory Visit: Payer: Self-pay | Admitting: Physician Assistant

## 2015-08-15 MED FILL — METOPROLOL TARTRATE 25 MG T: 25 | 30 days supply | Qty: 30 | Fill #2

## 2015-08-15 MED FILL — WARFARIN SODIUM 5 MG TABLET: 5 | 30 days supply | Qty: 60 | Fill #0

## 2015-08-15 MED FILL — CLOPIDOGREL 75 MG TABLET: 75 | 30 days supply | Qty: 30 | Fill #1

## 2015-08-15 NOTE — Telephone Encounter (Signed)
Please review for refill. Thanks!  

## 2015-08-27 ENCOUNTER — Ambulatory Visit (INDEPENDENT_AMBULATORY_CARE_PROVIDER_SITE_OTHER): Payer: Self-pay | Admitting: *Deleted

## 2015-08-27 DIAGNOSIS — I82441 Acute embolism and thrombosis of right tibial vein: Secondary | ICD-10-CM

## 2015-08-27 DIAGNOSIS — I2699 Other pulmonary embolism without acute cor pulmonale: Secondary | ICD-10-CM

## 2015-08-27 DIAGNOSIS — I824Z9 Acute embolism and thrombosis of unspecified deep veins of unspecified distal lower extremity: Secondary | ICD-10-CM

## 2015-08-27 DIAGNOSIS — Z7901 Long term (current) use of anticoagulants: Secondary | ICD-10-CM

## 2015-08-27 LAB — POCT INR: INR: 2.7

## 2015-08-30 ENCOUNTER — Ambulatory Visit: Payer: Self-pay | Attending: Family Medicine | Admitting: Family Medicine

## 2015-08-30 ENCOUNTER — Encounter: Payer: Self-pay | Admitting: Family Medicine

## 2015-08-30 ENCOUNTER — Ambulatory Visit (HOSPITAL_BASED_OUTPATIENT_CLINIC_OR_DEPARTMENT_OTHER): Payer: Self-pay | Admitting: Clinical

## 2015-08-30 VITALS — BP 105/73 | HR 73 | Temp 98.1°F | Resp 15 | Ht 75.0 in | Wt 339.6 lb

## 2015-08-30 DIAGNOSIS — F329 Major depressive disorder, single episode, unspecified: Secondary | ICD-10-CM

## 2015-08-30 DIAGNOSIS — Z1211 Encounter for screening for malignant neoplasm of colon: Secondary | ICD-10-CM

## 2015-08-30 DIAGNOSIS — Z95820 Peripheral vascular angioplasty status with implants and grafts: Secondary | ICD-10-CM

## 2015-08-30 DIAGNOSIS — Z959 Presence of cardiac and vascular implant and graft, unspecified: Secondary | ICD-10-CM

## 2015-08-30 DIAGNOSIS — Z789 Other specified health status: Secondary | ICD-10-CM

## 2015-08-30 DIAGNOSIS — I2699 Other pulmonary embolism without acute cor pulmonale: Secondary | ICD-10-CM

## 2015-08-30 DIAGNOSIS — E669 Obesity, unspecified: Secondary | ICD-10-CM | POA: Insufficient documentation

## 2015-08-30 DIAGNOSIS — Z889 Allergy status to unspecified drugs, medicaments and biological substances status: Secondary | ICD-10-CM

## 2015-08-30 DIAGNOSIS — R7303 Prediabetes: Secondary | ICD-10-CM | POA: Insufficient documentation

## 2015-08-30 DIAGNOSIS — Z79899 Other long term (current) drug therapy: Secondary | ICD-10-CM | POA: Insufficient documentation

## 2015-08-30 DIAGNOSIS — Z955 Presence of coronary angioplasty implant and graft: Secondary | ICD-10-CM | POA: Insufficient documentation

## 2015-08-30 DIAGNOSIS — Z7902 Long term (current) use of antithrombotics/antiplatelets: Secondary | ICD-10-CM | POA: Insufficient documentation

## 2015-08-30 DIAGNOSIS — F32A Depression, unspecified: Secondary | ICD-10-CM | POA: Insufficient documentation

## 2015-08-30 DIAGNOSIS — I2510 Atherosclerotic heart disease of native coronary artery without angina pectoris: Secondary | ICD-10-CM | POA: Insufficient documentation

## 2015-08-30 DIAGNOSIS — E785 Hyperlipidemia, unspecified: Secondary | ICD-10-CM | POA: Insufficient documentation

## 2015-08-30 DIAGNOSIS — Z7901 Long term (current) use of anticoagulants: Secondary | ICD-10-CM | POA: Insufficient documentation

## 2015-08-30 DIAGNOSIS — I723 Aneurysm of iliac artery: Secondary | ICD-10-CM | POA: Insufficient documentation

## 2015-08-30 DIAGNOSIS — Z86711 Personal history of pulmonary embolism: Secondary | ICD-10-CM | POA: Insufficient documentation

## 2015-08-30 LAB — POCT GLYCOSYLATED HEMOGLOBIN (HGB A1C): HEMOGLOBIN A1C: 6

## 2015-08-30 LAB — GLUCOSE, POCT (MANUAL RESULT ENTRY): POC GLUCOSE: 136 mg/dL — AB (ref 70–99)

## 2015-08-30 MED ORDER — METOPROLOL SUCCINATE ER 25 MG PO TB24: 25.0000 mg | ORAL_TABLET | Freq: Every day | ORAL | Status: DC

## 2015-08-30 MED ORDER — BUPROPION HCL ER (SR) 150 MG PO TB12: 150.0000 mg | ORAL_TABLET | Freq: Two times a day (BID) | ORAL | Status: DC

## 2015-08-30 MED ORDER — EZETIMIBE 10 MG PO TABS: 10.0000 mg | ORAL_TABLET | Freq: Every day | ORAL | Status: DC

## 2015-08-30 MED FILL — ?ZETIA 10 MG TABLET: 10 MG | 30 days supply | Qty: 30 | Fill #0

## 2015-08-30 MED FILL — BUPROPION SR 150 MG TABLET: 150 | 30 days supply | Qty: 60 | Fill #0

## 2015-08-30 MED FILL — METOPROLOL SUCC ER 25 MG TA: 25 | 30 days supply | Qty: 30 | Fill #0

## 2015-08-30 NOTE — Progress Notes (Signed)
ASSESSMENT: Pt currently experiencing symptoms of depression, needs [facility] and [facility]; would benefit from psychoeducation and brief therapeutic interventions regarding coping with symptoms of depression.  Stage of Change: contemplative  PLAN: 1. F/U with behavioral health consultant in one month 2. Psychiatric Medications: Wellbutrin (starting today). 3. Behavioral recommendation(s):   -Contact children and grandchildren this coming week -Consider reading educational material regarding coping with symptoms of depression SUBJECTIVE: Pt. referred by Dr Venetia NightAmao for symptoms of depression:  Pt. reports the following symptoms/concerns: Pt states he was treated for depression 9 years prior when his mother passed; father passed in recent months, currently cleaning out parents house triggering feelings of depression. Pt copes by spending time with children and grandchildren, though they all live out of state, and remembers Wellbutrin worked well in previous years. No SI, family supportive.  Duration of problem: At least one month Severity: moderate  OBJECTIVE: Orientation & Cognition: Oriented x3. Thought processes normal and appropriate to situation. Mood: teary. Affect: appropriate Appearance: appropriate Risk of harm to self or others: no known risk of harm to self or others Substance use: none Assessments administered: PHQ9: 20, GAD7: 12  Diagnosis: Depression CPT Code: F32.9 -------------------------------------------- Other(s) present in the room: none  Time spent with patient in exam room: 20 minutes, 12-12:20pm   [facility] screen [facility] 08/30/2015  Decreased Interest 3  Down, Depressed, Hopeless 3  PHQ - 2 Score 6  Altered sleeping 3  Tired, decreased energy 3  Change in appetite 3  Feeling bad or failure about yourself  3  Trouble concentrating 1  Moving slowly or fidgety/restless 1  Suicidal thoughts 0  PHQ-9 Score 20    GAD 7 : Generalized Anxiety Score 08/30/2015   Nervous, Anxious, on Edge 0  Control/stop worrying 1  Worry too much - different things 1  Trouble relaxing 2  Restless 0  Easily annoyed or irritable 3  Afraid - awful might happen 2  Total GAD 7 Score 9

## 2015-08-30 NOTE — Progress Notes (Signed)
Subjective:  Patient ID: Cory Nolan, male    DOB: 16-Mar-1956  Age: 60 y.o. MRN: [id number]  CC: Referral and skin tags   HPI Cory Nolan is a 60 year old male with a history of obesity, coronary artery disease status post DES stent x2  (to the proximal and mid to distal RCA on 03/08/15), pulmonary embolism (diagnosed in 02/2015, on anticoagulation with Coumadin), prediabetes (A1c of 6.2), right iliac artery aneurysm who comes into the clinic today for follow-up visit.  He was seen by cardiology in 06/2015 and Brilinta was switched to Plavix due to cost; last visit to Coumadin clinic was on 08/27/15 and INR was therapeutic at 2.7- he remains on 10 mg of Coumadin daily.  He complains of difficulty splitting his 25 mg metoprolol tablets into 12.5 mg and is requesting this be switched to once daily 25 mg tablets. He also has skin tags in his underarms and on his right eyelid which he has had chronically and has had to have them snipped off in the past.  He has an upcoming appointment for labs which have been ordered for him by cardiology.   Outpatient Prescriptions Prior to Visit  Medication Sig Dispense Refill  . Glucosamine HCl (GLUCOSAMINE PO) Take 1 capsule by mouth daily.    Marland Kitchen omega-3 acid ethyl esters (LOVAZA) 1 G capsule Take 1 capsule (1 g total) by mouth 2 (two) times daily. 30 capsule 11  . ranitidine (ZANTAC) 150 MG tablet Take 150-300 mg by mouth daily as needed for heartburn (also take with naproxen).     Marland Kitchen warfarin (COUMADIN) 5 MG tablet Take 1-2 tablets (5-10 mg total) by mouth daily. (Patient taking differently: Take 10 mg by mouth daily. ) 60 tablet 2  . metoprolol tartrate (LOPRESSOR) 25 MG tablet Take 0.5 tablets (12.5 mg total) by mouth 2 (two) times daily. 90 tablet 3  . nitroGLYCERIN (NITROSTAT) 0.4 MG SL tablet Place 1 tablet (0.4 mg total) under the tongue every 5 (five) minutes x 3 doses as needed for chest pain. (Patient not taking: Reported on 08/30/2015) 25 tablet  3  . Powders (ANTI MONKEY BUTT EX) Apply 1 application topically daily as needed (friction burns on upper legs). Reported on 08/30/2015    . PRESCRIPTION MEDICATION Place 3-4 drops into the left eye 5 (five) times daily as needed (dry eyes). Reported on 08/30/2015    . pravastatin (PRAVACHOL) 20 MG tablet Take 1 tablet (20 mg total) by mouth every evening. (Patient not taking: Reported on 07/09/2015) 90 tablet 3  . ticagrelor (BRILINTA) 90 MG TABS tablet Take 1 tablet (90 mg total) by mouth 2 (two) times daily. 60 tablet 0   No facility-administered medications prior to visit.    ROS Review of Systems Constitutional: Negative for fever, chills, diaphoresis, activity change, appetite change and fatigue. HENT: Negative for ear pain, nosebleeds, congestion, facial swelling, rhinorrhea, neck pain, neck stiffness and ear discharge.  Eyes: Negative for pain, discharge, redness, itching, left eye visual loss Respiratory: Negative for cough, choking, chest tightness, shortness of breath, wheezing and stridor.  Cardiovascular: Negative for chest pain, palpitations and leg swelling. Gastrointestinal: Negative for abdominal distention. Genitourinary: Negative for dysuria, urgency, frequency, hematuria, flank pain, decreased urine volume, difficulty urinating and dyspareunia.  Musculoskeletal: Negative for back pain, joint swelling, arthralgias and gait problem. Neurological: Negative for dizziness, tremors, seizures, syncope, facial asymmetry, speech difficulty, weakness, light-headedness, numbness and headaches.  Hematological: Negative for adenopathy. Does not bruise/bleed easily. Psychiatric/Behavioral: Negative for hallucinations,  behavioral problems, confusion, dysphoric mood, decreased concentration and agitation.   Objective:  BP 105/73 mmHg  Pulse 73  Temp(Src) 98.1 F (36.7 C)  Resp 15  Ht 6\' 3"  (1.905 m)  Wt 339 lb 9.6 oz (154.042 kg)  BMI 42.45 kg/m2  SpO2 98%  BP/Weight 08/30/2015  07/09/2015 05/14/2015  Systolic BP 105 110 112  Diastolic BP 73 80 78  Wt. (Lbs) 339.6 323 329.19  BMI 42.45 40.37 41.15      Physical Exam Constitutional: Patient appears well-developed and well-nourished, morbidly obese. HENT: Normocephalic, atraumatic, External right and left ear normal. Oropharynx is clear and moist.  Eyes: Conjunctivae and EOM are normal. PERRLA, no scleral icterus. Neck: Normal ROM. Neck supple. No JVD. No tracheal deviation. No thyromegaly. CVS: RRR, S1/S2 +, no murmurs, no gallops, no carotid bruit.  Pulmonary: Effort and breath sounds normal, no stridor, rhonchi, wheezes, rales.  Abdominal: Soft. BS +,  + distension, no tenderness, rebound or guarding.  Musculoskeletal: Normal range of motion. No edema and no tenderness.  Lymphadenopathy: No lymphadenopathy noted, cervical, inguinal or axillary Neuro: Alert. Normal reflexes, muscle tone coordination. No cranial nerve deficit. Skin: Multiple skin tags in bilateral axillary region and right eyelid  Psychiatric: Normal mood and affect. Behavior, judgment, thought content normal.  Assessment & Plan:   1. Prediabetes A1c of 6.2 - HgB A1c - Glucose (CBG)  2. Hyperlipidemia with target LDL less than 70 Unable to tolerate statin. Placed on Zetia  3. Bilateral pulmonary embolism, multiple 03/13/15 Currently on anticoagulation with Coumadin until 09/10/15 Managed by Coumadin clinic and is currently on 10 mg daily.  4. Iliac artery aneurysm, right (HCC) Repeat imaging in 1 year to assess for stability  5. Depression Placed on Wellbutrin. LCSW called in to see the patient for counseling session.  6. Screening for colon cancer. Referred to GI for colonoscopy.  7. CAD status post stent 2 No angina at this time. Continue Plavix. Switch from metoprolol immediate release to extended release and dosed at 25 mg daily patient request. Patient is to notify the clinic in the event of dizziness and is advised to  check blood pressures for hypertension as his blood pressure is on the soft side.  7. Skin tags Patient will be referred to Dermatology for excision of multiple tags once he is off Coumadin   Meds ordered this encounter  Medications  . buPROPion (WELLBUTRIN SR) 150 MG 12 hr tablet    Sig: Take 1 tablet (150 mg total) by mouth 2 (two) times daily.    Dispense:  60 tablet    Refill:  2  . metoprolol succinate (TOPROL XL) 25 MG 24 hr tablet    Sig: Take 1 tablet (25 mg total) by mouth daily.    Dispense:  30 tablet    Refill:  2  . ezetimibe (ZETIA) 10 MG tablet    Sig: Take 1 tablet (10 mg total) by mouth daily.    Dispense:  30 tablet    Refill:  3    Follow-up: Return in about 3 months (around 11/30/2015) for Follow-up on coronary artery disease.   Jaclyn ShaggyEnobong Amao MD

## 2015-08-30 NOTE — Progress Notes (Signed)
Colonoscopy referral Skin tags States his metoprolol is too small for him to cut in half

## 2015-09-16 MED FILL — ZETIA 10 MG TABLET: 10 | 30 days supply | Qty: 30 | Fill #1

## 2015-09-16 MED FILL — CLOPIDOGREL 75 MG TABLET: 75 | 30 days supply | Qty: 30 | Fill #2

## 2015-09-17 ENCOUNTER — Ambulatory Visit (INDEPENDENT_AMBULATORY_CARE_PROVIDER_SITE_OTHER): Payer: Self-pay

## 2015-09-17 DIAGNOSIS — Z7901 Long term (current) use of anticoagulants: Secondary | ICD-10-CM

## 2015-09-17 DIAGNOSIS — I82441 Acute embolism and thrombosis of right tibial vein: Secondary | ICD-10-CM

## 2015-09-17 DIAGNOSIS — I2699 Other pulmonary embolism without acute cor pulmonale: Secondary | ICD-10-CM

## 2015-09-17 LAB — POCT INR: INR: 2.7

## 2015-09-23 MED FILL — WARFARIN SODIUM 5 MG TABLET: 5 | 30 days supply | Qty: 60 | Fill #1

## 2015-10-07 ENCOUNTER — Telehealth: Payer: Self-pay | Admitting: Clinical

## 2015-10-07 NOTE — Telephone Encounter (Signed)
Termination w pt; pt aware [facility], prior to end April.

## 2015-10-14 ENCOUNTER — Ambulatory Visit (INDEPENDENT_AMBULATORY_CARE_PROVIDER_SITE_OTHER): Payer: Self-pay | Admitting: *Deleted

## 2015-10-14 DIAGNOSIS — I82441 Acute embolism and thrombosis of right tibial vein: Secondary | ICD-10-CM

## 2015-10-14 DIAGNOSIS — I2699 Other pulmonary embolism without acute cor pulmonale: Secondary | ICD-10-CM

## 2015-10-14 DIAGNOSIS — Z7901 Long term (current) use of anticoagulants: Secondary | ICD-10-CM

## 2015-10-14 LAB — POCT INR: INR: 4

## 2015-10-21 MED FILL — ZETIA 10 MG TABLET: 10 | 30 days supply | Qty: 30 | Fill #2

## 2015-10-21 MED FILL — CLOPIDOGREL 75 MG TABLET: 75 | 30 days supply | Qty: 30 | Fill #3

## 2015-10-24 MED FILL — WARFARIN SODIUM 5 MG TABLET: 5 | 30 days supply | Qty: 60 | Fill #2

## 2015-10-28 MED FILL — METOPROLOL SUCC ER 25 MG TA: 25 | 30 days supply | Qty: 30 | Fill #1

## 2015-10-29 ENCOUNTER — Ambulatory Visit (INDEPENDENT_AMBULATORY_CARE_PROVIDER_SITE_OTHER): Payer: Self-pay

## 2015-10-29 DIAGNOSIS — I82441 Acute embolism and thrombosis of right tibial vein: Secondary | ICD-10-CM

## 2015-10-29 DIAGNOSIS — Z7901 Long term (current) use of anticoagulants: Secondary | ICD-10-CM

## 2015-10-29 DIAGNOSIS — I2699 Other pulmonary embolism without acute cor pulmonale: Secondary | ICD-10-CM

## 2015-10-29 LAB — POCT INR: INR: 3.9

## 2015-11-12 ENCOUNTER — Ambulatory Visit (INDEPENDENT_AMBULATORY_CARE_PROVIDER_SITE_OTHER): Payer: Self-pay | Admitting: *Deleted

## 2015-11-12 DIAGNOSIS — Z7901 Long term (current) use of anticoagulants: Secondary | ICD-10-CM

## 2015-11-12 DIAGNOSIS — I82441 Acute embolism and thrombosis of right tibial vein: Secondary | ICD-10-CM

## 2015-11-12 DIAGNOSIS — I2699 Other pulmonary embolism without acute cor pulmonale: Secondary | ICD-10-CM

## 2015-11-12 LAB — POCT INR: INR: 3.1

## 2015-11-15 ENCOUNTER — Other Ambulatory Visit: Payer: Self-pay | Admitting: Cardiology

## 2015-11-15 MED FILL — ?METOPROLOL 25 MG TABLET: 25 | 30 days supply | Qty: 30 | Fill #3

## 2015-11-15 MED FILL — BUPROPION SR 150 MG TABLET: 150 | 30 days supply | Qty: 60 | Fill #1

## 2015-11-15 MED FILL — ZETIA 10 MG TABLET: 10 | 30 days supply | Qty: 30 | Fill #3

## 2015-11-19 MED FILL — WARFARIN SODIUM 5 MG TABLET: 5 | 30 days supply | Qty: 60 | Fill #0

## 2015-11-27 ENCOUNTER — Telehealth: Payer: Self-pay | Admitting: *Deleted

## 2015-11-27 ENCOUNTER — Ambulatory Visit (INDEPENDENT_AMBULATORY_CARE_PROVIDER_SITE_OTHER): Payer: Self-pay | Admitting: *Deleted

## 2015-11-27 DIAGNOSIS — I82441 Acute embolism and thrombosis of right tibial vein: Secondary | ICD-10-CM

## 2015-11-27 DIAGNOSIS — I2699 Other pulmonary embolism without acute cor pulmonale: Secondary | ICD-10-CM

## 2015-11-27 DIAGNOSIS — Z7901 Long term (current) use of anticoagulants: Secondary | ICD-10-CM

## 2015-11-27 LAB — POCT INR: INR: 2.4

## 2015-11-27 NOTE — Telephone Encounter (Signed)
Patient stated that he [facility], but they informed him that this medication had been d'cd by Dr Swazil[place]Jordan. He would like a call at [phone] to discuss.

## 2015-11-27 NOTE — Telephone Encounter (Signed)
Returned call to patient.[facility] him Dr.Jordan stopped plavix and he was calling to make sure.Advised I will ask Dr.Jordan in am and call him back.

## 2015-11-28 MED ORDER — CLOPIDOGREL BISULFATE 75 MG PO TABS: 75.0000 mg | ORAL_TABLET | Freq: Every day | ORAL | Status: DC

## 2015-11-28 MED FILL — CLOPIDOGREL 75 MG TABLET: 75 | 30 days supply | Qty: 30 | Fill #0

## 2015-11-28 NOTE — Telephone Encounter (Signed)
Returned call to patient spoke to Dr.Jordan he advised needs to continue Plavix.Refill sent to pharmacy.

## 2015-12-18 ENCOUNTER — Ambulatory Visit (INDEPENDENT_AMBULATORY_CARE_PROVIDER_SITE_OTHER): Payer: Self-pay | Admitting: *Deleted

## 2015-12-18 DIAGNOSIS — I824Z9 Acute embolism and thrombosis of unspecified deep veins of unspecified distal lower extremity: Secondary | ICD-10-CM

## 2015-12-18 DIAGNOSIS — I2699 Other pulmonary embolism without acute cor pulmonale: Secondary | ICD-10-CM

## 2015-12-18 DIAGNOSIS — Z7901 Long term (current) use of anticoagulants: Secondary | ICD-10-CM

## 2015-12-18 DIAGNOSIS — I82441 Acute embolism and thrombosis of right tibial vein: Secondary | ICD-10-CM

## 2015-12-18 LAB — POCT INR: INR: 2.7

## 2015-12-30 ENCOUNTER — Other Ambulatory Visit: Payer: Self-pay | Admitting: Family Medicine

## 2015-12-30 MED FILL — METOPROLOL SUCC ER 25 MG TA: 25 | 30 days supply | Qty: 30 | Fill #2

## 2015-12-30 MED FILL — ZETIA 10 MG TABLET: 10 | 30 days supply | Qty: 30 | Fill #0

## 2015-12-30 MED FILL — WARFARIN SODIUM 5 MG TABLET: 5 | 30 days supply | Qty: 60 | Fill #1

## 2015-12-30 MED FILL — CLOPIDOGREL 75 MG TABLET: 75 | 30 days supply | Qty: 30 | Fill #1

## 2016-01-07 ENCOUNTER — Ambulatory Visit: Payer: Self-pay

## 2016-01-13 ENCOUNTER — Other Ambulatory Visit: Payer: Self-pay | Admitting: Cardiology

## 2016-01-13 LAB — BASIC METABOLIC PANEL
BUN: 13 mg/dL (ref 7–25)
CALCIUM: 8.9 mg/dL (ref 8.6–10.3)
CO2: 26 mmol/L (ref 20–31)
Chloride: 102 mmol/L (ref 98–110)
Creat: 0.71 mg/dL (ref 0.70–1.25)
GLUCOSE: 113 mg/dL — AB (ref 65–99)
Potassium: 4.5 mmol/L (ref 3.5–5.3)
SODIUM: 137 mmol/L (ref 135–146)

## 2016-01-13 LAB — LIPID PANEL
CHOL/HDL RATIO: 5.9 ratio — AB (ref <=5.0)
CHOLESTEROL: 189 mg/dL (ref 125–200)
HDL: 32 mg/dL — ABNORMAL LOW (ref >=40)
LDL Cholesterol: 114 mg/dL (ref <130)
TRIGLYCERIDES: 214 mg/dL — AB (ref <150)
VLDL: 43 mg/dL — ABNORMAL HIGH (ref <30)

## 2016-01-13 LAB — HEPATIC FUNCTION PANEL
ALT: 32 U/L (ref 9–46)
AST: 20 U/L (ref 10–35)
Albumin: 4 g/dL (ref 3.6–5.1)
Alkaline Phosphatase: 71 U/L (ref 40–115)
BILIRUBIN DIRECT: 0.1 mg/dL (ref <=0.2)
Indirect Bilirubin: 0.8 mg/dL (ref 0.2–1.2)
TOTAL PROTEIN: 6.5 g/dL (ref 6.1–8.1)
Total Bilirubin: 0.9 mg/dL (ref 0.2–1.2)

## 2016-01-20 NOTE — Progress Notes (Signed)
CARDIOLOGY OFFICE NOTE  Date:  01/21/2016    Cory Nolan Date of Birth: Jun 22, 1956 Medical Record #[id number]  PCP:  Jaclyn Shaggy, MD  Cardiologist:  Adessa Primiano [place]  MD  Chief Complaint  Patient presents with  . Follow-up    pt c/o chest discomfort from taking Plavix and occasional swelling in legs/feet/ankles  . Coronary Artery Disease    History of Present Illness: Cory Nolan is a 60 y.o. male who presents today for follow up CAD and PE.   He has a history of morbid obesity, hyperlipidemia, type 2 DM, and CAD status post DES to mid to distal RCA on 03/08/2015. He was discharged on aspirin, Brilinta and high-dose statin.   He was admitted on 03/13/2015 with  multiple bilateral pulmonary emboli including saddle embolus at the bifurcation of right pulmonary artery.   Limited echocardiogram was obtained on 9/21 as well, EF is 55-60%, no regional wall motion abnormality, mildly dilated aortic root at 40 mm, poor caustic window reduces accuracy of RV assessment, RV is upper limits of normal to mildly dilated, PA peak pressure 48. Lower extremity venous Doppler showed evidence of DVT in the right posterior tibial vein. Patient was started on Coumadin bridging with Heparin. 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 After discussing with Dr. Allyson Sabal, this should be followed up as outpatient yearly via either Doppler or CT scan, and it is usually not treated unless it is beyond 3 cm in diameter.  He is intolerant of statins due to myalgias and hepatic steatosis.   On follow up today he is doing well. He denies any chest pain or SOB. Minimal edema. He was previously switched from Brilinta to Plavix due to dyspnea and cost. 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 He admits he needs to lose weight.     Past Medical History:  Diagnosis Date  . Bilateral pulmonary embolism, multiple 03/13/15 03/08/15  . Coronary artery disease 03/08/15  .  Diabetes mellitus type 2 in obese (HCC)   . Diarrhea 03/15/2015  . Dyslipidemia   . Morbid obesity (HCC)   . S/P angioplasty with stent-RCA prox and distal-DES 03/06/15 03/08/15    Past Surgical History:  Procedure Laterality Date  . CARDIAC CATHETERIZATION N/A 03/08/2015   Procedure: Left Heart Cath and Coronary Angiography;  Surgeon: Josey Forcier M [place], MD;  Location: [facility] INVASIVE CV LAB;  Service: Cardiovascular;  Laterality: N/A;  . KNEE SURGERY       Medications: Current Outpatient Prescriptions  Medication Sig Dispense Refill  . acetaminophen (TYLENOL) 650 MG CR tablet Take 1,300 mg by mouth daily.    Marland Kitchen buPROPion (WELLBUTRIN SR) 150 MG 12 hr tablet Take 1 tablet (150 mg total) by mouth 2 (two) times daily. 60 tablet 2  . clopidogrel (PLAVIX) 75 MG tablet Take 1 tablet (75 mg total) by mouth daily. 30 tablet 6  . Glucosamine HCl (GLUCOSAMINE PO) Take 1 capsule by mouth daily.    . metoprolol succinate (TOPROL XL) 25 MG 24 hr tablet Take 1 tablet (25 mg total) by mouth daily. 30 tablet 2  . nitroGLYCERIN (NITROSTAT) 0.4 MG SL tablet Place 1 tablet (0.4 mg total) under the tongue every 5 (five) minutes x 3 doses as needed for chest pain. 25 tablet 3  . omega-3 acid ethyl esters (LOVAZA) 1 G capsule Take 1 capsule (1 g total) by mouth 2 (two) times daily. 30 capsule 11  .  Powders (ANTI MONKEY BUTT EX) Apply 1 application topically daily as needed (friction burns on upper legs). Reported on 08/30/2015    . PRESCRIPTION MEDICATION Place 3-4 drops into the left eye 5 (five) times daily as needed (dry eyes). Reported on 08/30/2015    . ranitidine (ZANTAC) 150 MG tablet Take 150-300 mg by mouth daily as needed for heartburn (also take with naproxen).     Marland Kitchen warfarin (COUMADIN) 5 MG tablet TAKE 1-2 TABLETS BY MOUTH DAILY. 60 tablet 2  . ZETIA 10 MG tablet TAKE 1 TABLET BY MOUTH DAILY. 30 tablet 2   No current facility-administered medications for this visit.     Allergies: No Known  Allergies  Social History: The patient  reports that he quit smoking about 8 years ago. His smokeless tobacco use includes Chew. He reports that he drinks alcohol.   Family History: The patient's family history includes Bone cancer in his mother; Breast cancer in his mother; Dementia in his father; Heart attack in his mother; Heart disease in his mother.   Review of Systems: Please see the history of present illness.   Otherwise, the review of systems is positive for none.   All other systems are reviewed and negative.   Physical Exam: VS:  BP 112/78   Pulse 71   Ht 6\' 3"  (1.905 m)   Wt (!) 343 lb 6.4 oz (155.8 kg)   BMI 42.92 kg/m  .  BMI Body mass index is 42.92 kg/m.  Wt Readings from Last 3 Encounters:  01/21/16 (!) 343 lb 6.4 oz (155.8 kg)  08/30/15 (!) 339 lb 9.6 oz (154 kg)  07/09/15 (!) 323 lb (146.5 kg)    General: Pleasant. He is morbidly obese but in no acute distress. HEENT: Normal but with deformity of the left eye.  Neck: Supple, no JVD, carotid bruits, or masses noted.  Cardiac: Regular rate and rhythm. No murmurs, rubs, or gallops. No edema.  Respiratory:  Lungs are clear to auscultation bilaterally with normal work of breathing.  GI: Soft and nontender. Obese.  MS: No deformity or atrophy. Gait and ROM intact.  Skin: Warm and dry. Color is normal.  Neuro:  Strength and sensation are intact and no gross focal deficits noted.  Psych: Alert, appropriate and with normal affect.   LABORATORY DATA:  EKG:  EKG is not ordered today.   Lab Results  Component Value Date   WBC 6.6 03/27/2015   HGB 14.0 03/27/2015   HCT 39.5 03/27/2015   PLT 285 03/27/2015   GLUCOSE 113 (H) 01/13/2016   CHOL 189 01/13/2016   TRIG 214 (H) 01/13/2016   HDL 32 (L) 01/13/2016   LDLCALC 114 01/13/2016   ALT 32 01/13/2016   AST 20 01/13/2016   NA 137 01/13/2016   K 4.5 01/13/2016   CL 102 01/13/2016   CREATININE 0.71 01/13/2016   BUN 13 01/13/2016   CO2 26 01/13/2016   INR  3.4 01/21/2016   HGBA1C 6 08/30/2015   Lab Results  Component Value Date   INR 3.4 01/21/2016   INR 2.7 12/18/2015   INR 2.4 11/27/2015     BNP (last 3 results)  Recent Labs  03/08/15 1035 03/13/15 1335  BNP 5.7 22.2    ProBNP (last 3 results) No results for input(s): PROBNP in the last 8760 hours.   Other Studies Reviewed Today: none      Assessment/Plan: 1. Bilateral pulmonary embolism, multiple 03/13/15 (Acute DVT of right tibial vein): - he is  on coumadin and INR 3.4. Adjusted by pharmacy today.  No problems noted. We discussed guidelines suggesting 6 months of anticoagulation but he is very concerned about his risk for recurrent events and wants to take coumadin long term.  2. CAD S/P angioplasty with stent-RCA prox and distal-DES 03/08/15 - recommend he discontinue Plavix in 6 weeks. Will continue with Coumadin only after this. - Discontinued statin secondary to hepatic steatosis and  statin related myalgias   3. Obesity- Morbid - concern for sleep apnea - [facility] & dietary counseling.  - Would benefit from sleep study as an outpatient. He cannot currently afford without insurance.  4.  Hyperlipidemia with target LDL less than 70  - significant myalgia symptoms have resolved having stopped taking statin. Hold statin for now,. Especially in light of severe hepatic steatosis.Marland Kitchen  - not a candidate for Zetia due to cost.   5. Hepatic steatosis - Likely related to him being severely obese. Needs to focus on heart healthy diet. Admits to eating a lot of sweets. Focus on weight loss.  6. Right iliac artery aneurysm: Should be monitored with outpatient Doppler or CT scan in one year. These aneurysms are not usually treated unless they get beyond 3 cm in diameter.   Current medicines are reviewed with the patient today.  The patient does not have concerns regarding medicines other than what has been noted above.  The following changes have been made:  See  above.  Labs/ tests ordered today include:    No orders of the defined types were placed in this encounter.    Disposition:   FU with Dr. Swaziland in 6 months.   Patient is agreeable to this plan and will call if any problems develop in the interim.   Signed: Argelio Granier [place] MD, [facility]    01/21/2016 12:37 PM  [facility]

## 2016-01-21 ENCOUNTER — Encounter: Payer: Self-pay | Admitting: Cardiology

## 2016-01-21 ENCOUNTER — Ambulatory Visit (INDEPENDENT_AMBULATORY_CARE_PROVIDER_SITE_OTHER): Payer: Self-pay | Admitting: Pharmacist Clinician (PhC)/ Clinical Pharmacy Specialist

## 2016-01-21 ENCOUNTER — Ambulatory Visit (INDEPENDENT_AMBULATORY_CARE_PROVIDER_SITE_OTHER): Payer: Self-pay | Admitting: Cardiology

## 2016-01-21 VITALS — BP 112/78 | HR 71 | Ht 75.0 in | Wt 343.4 lb

## 2016-01-21 DIAGNOSIS — Z9861 Coronary angioplasty status: Secondary | ICD-10-CM

## 2016-01-21 DIAGNOSIS — I2510 Atherosclerotic heart disease of native coronary artery without angina pectoris: Secondary | ICD-10-CM

## 2016-01-21 DIAGNOSIS — I82441 Acute embolism and thrombosis of right tibial vein: Secondary | ICD-10-CM

## 2016-01-21 DIAGNOSIS — I2699 Other pulmonary embolism without acute cor pulmonale: Secondary | ICD-10-CM

## 2016-01-21 DIAGNOSIS — E785 Hyperlipidemia, unspecified: Secondary | ICD-10-CM

## 2016-01-21 DIAGNOSIS — Z7901 Long term (current) use of anticoagulants: Secondary | ICD-10-CM

## 2016-01-21 DIAGNOSIS — I723 Aneurysm of iliac artery: Secondary | ICD-10-CM

## 2016-01-21 LAB — POCT INR: INR: 3.4

## 2016-01-21 NOTE — Patient Instructions (Signed)
Continue your current therapy  You may stop taking Plavix after March 07, 2016.  Focus on weight loss.  I will see you in 6 months.

## 2016-01-27 ENCOUNTER — Telehealth: Payer: Self-pay | Admitting: Cardiology

## 2016-01-27 NOTE — Telephone Encounter (Signed)
New message ° ° °Patient returning call back to nurse from last week   °

## 2016-01-27 NOTE — Telephone Encounter (Signed)
Returned call to patient lab results given. 

## 2016-02-04 ENCOUNTER — Other Ambulatory Visit: Payer: Self-pay | Admitting: Family Medicine

## 2016-02-04 MED FILL — **ZETIA 10MG TABLET: 10 MG | 30 days supply | Qty: 30 | Fill #1

## 2016-02-04 MED FILL — WARFARIN SODIUM 5 MG TABLET: 5 | 30 days supply | Qty: 60 | Fill #2

## 2016-02-04 MED FILL — CLOPIDOGREL 75 MG TABLET: 75 | 30 days supply | Qty: 30 | Fill #2

## 2016-02-04 MED FILL — METOPROLOL SUCC ER 25 MG TA: 25 | 30 days supply | Qty: 30 | Fill #0

## 2016-02-11 ENCOUNTER — Encounter (INDEPENDENT_AMBULATORY_CARE_PROVIDER_SITE_OTHER): Payer: Self-pay

## 2016-02-11 ENCOUNTER — Ambulatory Visit (INDEPENDENT_AMBULATORY_CARE_PROVIDER_SITE_OTHER): Payer: Self-pay | Admitting: *Deleted

## 2016-02-11 DIAGNOSIS — Z7901 Long term (current) use of anticoagulants: Secondary | ICD-10-CM

## 2016-02-11 DIAGNOSIS — I2699 Other pulmonary embolism without acute cor pulmonale: Secondary | ICD-10-CM

## 2016-02-11 DIAGNOSIS — I82441 Acute embolism and thrombosis of right tibial vein: Secondary | ICD-10-CM

## 2016-02-11 LAB — POCT INR: INR: 1.8

## 2016-02-26 ENCOUNTER — Encounter (HOSPITAL_COMMUNITY): Payer: Self-pay | Admitting: Student-PharmD

## 2016-02-27 ENCOUNTER — Ambulatory Visit (INDEPENDENT_AMBULATORY_CARE_PROVIDER_SITE_OTHER): Payer: Self-pay | Admitting: *Deleted

## 2016-02-27 ENCOUNTER — Encounter (INDEPENDENT_AMBULATORY_CARE_PROVIDER_SITE_OTHER): Payer: Self-pay

## 2016-02-27 DIAGNOSIS — I2699 Other pulmonary embolism without acute cor pulmonale: Secondary | ICD-10-CM

## 2016-02-27 DIAGNOSIS — I82441 Acute embolism and thrombosis of right tibial vein: Secondary | ICD-10-CM

## 2016-02-27 DIAGNOSIS — Z7901 Long term (current) use of anticoagulants: Secondary | ICD-10-CM

## 2016-02-27 LAB — POCT INR: INR: 1.6

## 2016-03-06 ENCOUNTER — Ambulatory Visit: Payer: Self-pay | Admitting: Podiatry

## 2016-03-11 ENCOUNTER — Other Ambulatory Visit: Payer: Self-pay | Admitting: Cardiology

## 2016-03-11 MED FILL — METOPROLOL SUCC ER 25 MG TA: 25 | 30 days supply | Qty: 30 | Fill #1

## 2016-03-11 MED FILL — WARFARIN SODIUM 5 MG TABLET: 5 | 30 days supply | Qty: 60 | Fill #0

## 2016-03-11 MED FILL — EZETIMIBE 10 MG TABLET: 10 | 30 days supply | Qty: 30 | Fill #2

## 2016-03-12 ENCOUNTER — Ambulatory Visit (INDEPENDENT_AMBULATORY_CARE_PROVIDER_SITE_OTHER): Payer: Self-pay | Admitting: *Deleted

## 2016-03-12 ENCOUNTER — Encounter (INDEPENDENT_AMBULATORY_CARE_PROVIDER_SITE_OTHER): Payer: Self-pay

## 2016-03-12 DIAGNOSIS — Z7901 Long term (current) use of anticoagulants: Secondary | ICD-10-CM

## 2016-03-12 DIAGNOSIS — I2699 Other pulmonary embolism without acute cor pulmonale: Secondary | ICD-10-CM

## 2016-03-12 DIAGNOSIS — I82441 Acute embolism and thrombosis of right tibial vein: Secondary | ICD-10-CM

## 2016-03-12 LAB — POCT INR: INR: 4.5

## 2016-03-19 ENCOUNTER — Ambulatory Visit (INDEPENDENT_AMBULATORY_CARE_PROVIDER_SITE_OTHER): Payer: Self-pay | Admitting: *Deleted

## 2016-03-19 DIAGNOSIS — I82441 Acute embolism and thrombosis of right tibial vein: Secondary | ICD-10-CM

## 2016-03-19 DIAGNOSIS — I2699 Other pulmonary embolism without acute cor pulmonale: Secondary | ICD-10-CM

## 2016-03-19 DIAGNOSIS — Z7901 Long term (current) use of anticoagulants: Secondary | ICD-10-CM

## 2016-03-19 LAB — POCT INR: INR: 2.3

## 2016-04-02 ENCOUNTER — Ambulatory Visit (INDEPENDENT_AMBULATORY_CARE_PROVIDER_SITE_OTHER): Payer: Self-pay

## 2016-04-02 DIAGNOSIS — I82441 Acute embolism and thrombosis of right tibial vein: Secondary | ICD-10-CM

## 2016-04-02 DIAGNOSIS — Z7901 Long term (current) use of anticoagulants: Secondary | ICD-10-CM

## 2016-04-02 DIAGNOSIS — I2699 Other pulmonary embolism without acute cor pulmonale: Secondary | ICD-10-CM

## 2016-04-02 LAB — POCT INR: INR: 2.4

## 2016-04-20 ENCOUNTER — Other Ambulatory Visit: Payer: Self-pay | Admitting: Family Medicine

## 2016-04-20 MED FILL — WARFARIN SODIUM 5 MG TABLET: 5 | 30 days supply | Qty: 60 | Fill #1

## 2016-04-20 MED FILL — METOPROLOL SUCC ER 25 MG TA: 25 | 30 days supply | Qty: 30 | Fill #2

## 2016-04-20 MED FILL — EZETIMIBE 10 MG TABLET: 10 | 30 days supply | Qty: 30 | Fill #0

## 2016-04-30 ENCOUNTER — Ambulatory Visit (INDEPENDENT_AMBULATORY_CARE_PROVIDER_SITE_OTHER): Payer: Self-pay | Admitting: *Deleted

## 2016-04-30 DIAGNOSIS — I82441 Acute embolism and thrombosis of right tibial vein: Secondary | ICD-10-CM

## 2016-04-30 DIAGNOSIS — Z7901 Long term (current) use of anticoagulants: Secondary | ICD-10-CM

## 2016-04-30 DIAGNOSIS — I2699 Other pulmonary embolism without acute cor pulmonale: Secondary | ICD-10-CM

## 2016-04-30 LAB — POCT INR: INR: 2.6

## 2016-05-22 MED FILL — WARFARIN SODIUM 5 MG TABLET: 5 | 30 days supply | Qty: 60 | Fill #2

## 2016-05-22 MED FILL — EZETIMIBE 10 MG TABLET: 10 | 30 days supply | Qty: 30 | Fill #1

## 2016-06-11 ENCOUNTER — Ambulatory Visit (INDEPENDENT_AMBULATORY_CARE_PROVIDER_SITE_OTHER): Payer: Self-pay | Admitting: *Deleted

## 2016-06-11 DIAGNOSIS — Z7901 Long term (current) use of anticoagulants: Secondary | ICD-10-CM

## 2016-06-11 DIAGNOSIS — I82441 Acute embolism and thrombosis of right tibial vein: Secondary | ICD-10-CM

## 2016-06-11 DIAGNOSIS — I2699 Other pulmonary embolism without acute cor pulmonale: Secondary | ICD-10-CM

## 2016-06-11 LAB — POCT INR: INR: 3.1

## 2016-06-25 MED FILL — EZETIMIBE 10 MG TABLET: 10 | 30 days supply | Qty: 30 | Fill #2

## 2016-06-25 MED FILL — ?WARFARIN SODIUM 5 MG TABLE: 5 | 30 days supply | Qty: 60 | Fill #3

## 2016-07-19 NOTE — Progress Notes (Deleted)
CARDIOLOGY OFFICE NOTE  Date:  07/19/2016    Cory Nolan Date of Birth: 1956-05-31 Medical Record #[id number]  PCP:  Jaclyn Shaggy, MD  Cardiologist:  Peter Swaziland  MD  No chief complaint on file.   History of Present Illness: Cory Nolan is a 61 y.o. male who presents today for follow up CAD and PE.   He has a history of morbid obesity, hyperlipidemia, type 2 DM, and CAD status post DES to mid to distal RCA on 03/08/2015. He was discharged on aspirin, Brilinta and high-dose statin.   He was admitted on 03/13/2015 with  multiple bilateral pulmonary emboli including saddle embolus at the bifurcation of right pulmonary artery.   Limited echocardiogram was obtained on 9/21 as well, EF is 55-60%, no regional wall motion abnormality, mildly dilated aortic root at 40 mm, poor caustic window reduces accuracy of RV assessment, RV is upper limits of normal to mildly dilated, PA peak pressure 48. Lower extremity venous Doppler showed evidence of DVT in the right posterior tibial vein. Patient was started on Coumadin bridging with Heparin. 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 After discussing with Dr. Allyson Sabal, this should be followed up as outpatient yearly via either Doppler or CT scan, and it is usually not treated unless it is beyond 3 cm in diameter.  He is intolerant of statins due to myalgias and hepatic steatosis.   On follow up today he is doing well. He denies any chest pain or SOB. Minimal edema. He was previously switched from Brilinta to Plavix due to dyspnea and cost. 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 He admits he needs to lose weight.     Past Medical History:  Diagnosis Date  . Bilateral pulmonary embolism, multiple 03/13/15 03/08/15  . Coronary artery disease 03/08/15  . Diabetes mellitus type 2 in obese (HCC)   . Diarrhea 03/15/2015  . Dyslipidemia   . Morbid obesity (HCC)   . S/P angioplasty with stent-RCA  prox and distal-DES 03/06/15 03/08/15    Past Surgical History:  Procedure Laterality Date  . CARDIAC CATHETERIZATION N/A 03/08/2015   Procedure: Left Heart Cath and Coronary Angiography;  Surgeon: Peter M Swaziland, MD;  Location: [facility] INVASIVE CV LAB;  Service: Cardiovascular;  Laterality: N/A;  . KNEE SURGERY       Medications: Current Outpatient Prescriptions  Medication Sig Dispense Refill  . acetaminophen (TYLENOL) 650 MG CR tablet Take 1,300 mg by mouth daily.    Marland Kitchen buPROPion (WELLBUTRIN SR) 150 MG 12 hr tablet Take 1 tablet (150 mg total) by mouth 2 (two) times daily. 60 tablet 2  . ezetimibe (ZETIA) 10 MG tablet TAKE 1 TABLET BY MOUTH DAILY. 30 tablet 2  . Glucosamine HCl (GLUCOSAMINE PO) Take 1 capsule by mouth daily.    . metoprolol succinate (TOPROL-XL) 25 MG 24 hr tablet TAKE 1 TABLET BY MOUTH DAILY. 30 tablet 2  . nitroGLYCERIN (NITROSTAT) 0.4 MG SL tablet Place 1 tablet (0.4 mg total) under the tongue every 5 (five) minutes x 3 doses as needed for chest pain. 25 tablet 3  . omega-3 acid ethyl esters (LOVAZA) 1 G capsule Take 1 capsule (1 g total) by mouth 2 (two) times daily. 30 capsule 11  . Powders (ANTI MONKEY BUTT EX) Apply 1 application topically daily as needed (friction burns on upper legs). Reported on 08/30/2015    . PRESCRIPTION MEDICATION Place 3-4 drops into the  left eye 5 (five) times daily as needed (dry eyes). Reported on 08/30/2015    . ranitidine (ZANTAC) 150 MG tablet Take 150-300 mg by mouth daily as needed for heartburn (also take with naproxen).     Marland Kitchen. warfarin (COUMADIN) 5 MG tablet TAKE 1 TO 2 TABLETS BY MOUTH DAILY. 60 tablet 3   No current facility-administered medications for this visit.     Allergies: No Known Allergies  Social History: The patient  reports that he quit smoking about 9 years ago. His smokeless tobacco use includes Chew. He reports that he drinks alcohol.   Family History: The patient's family history includes Bone cancer in his  mother; Breast cancer in his mother; Dementia in his father; Heart attack in his mother; Heart disease in his mother.   Review of Systems: Please see the history of present illness.   Otherwise, the review of systems is positive for none.   All other systems are reviewed and negative.   Physical Exam: VS:  There were no vitals taken for this visit. Marland Kitchen.  BMI There is no height or weight on file to calculate BMI.  Wt Readings from Last 3 Encounters:  01/21/16 (!) 343 lb 6.4 oz (155.8 kg)  08/30/15 (!) 339 lb 9.6 oz (154 kg)  07/09/15 (!) 323 lb (146.5 kg)    General: Pleasant. He is morbidly obese but in no acute distress. HEENT: Normal but with deformity of the left eye.  Neck: Supple, no JVD, carotid bruits, or masses noted.  Cardiac: Regular rate and rhythm. No murmurs, rubs, or gallops. No edema.  Respiratory:  Lungs are clear to auscultation bilaterally with normal work of breathing.  GI: Soft and nontender. Obese.  MS: No deformity or atrophy. Gait and ROM intact.  Skin: Warm and dry. Color is normal.  Neuro:  Strength and sensation are intact and no gross focal deficits noted.  Psych: Alert, appropriate and with normal affect.   LABORATORY DATA:  EKG:  EKG is not ordered today.   Lab Results  Component Value Date   WBC 6.6 03/27/2015   HGB 14.0 03/27/2015   HCT 39.5 03/27/2015   PLT 285 03/27/2015   GLUCOSE 113 (H) 01/13/2016   CHOL 189 01/13/2016   TRIG 214 (H) 01/13/2016   HDL 32 (L) 01/13/2016   LDLCALC 114 01/13/2016   ALT 32 01/13/2016   AST 20 01/13/2016   NA 137 01/13/2016   K 4.5 01/13/2016   CL 102 01/13/2016   CREATININE 0.71 01/13/2016   BUN 13 01/13/2016   CO2 26 01/13/2016   INR 3.1 06/11/2016   HGBA1C 6 08/30/2015   Lab Results  Component Value Date   INR 3.1 06/11/2016   INR 2.6 04/30/2016   INR 2.4 04/02/2016     BNP (last 3 results) No results for input(s): BNP in the last 8760 hours.  ProBNP (last 3 results) No results for input(s):  PROBNP in the last 8760 hours.   Other Studies Reviewed Today: none      Assessment/Plan: 1. Bilateral pulmonary embolism, multiple 03/13/15 (Acute DVT of right tibial vein): - he is on coumadin and INR 3.4. Adjusted by pharmacy today.  No problems noted. We discussed guidelines suggesting 6 months of anticoagulation but he is very concerned about his risk for recurrent events and wants to take coumadin long term.  2. CAD S/P angioplasty with stent-RCA prox and distal-DES 03/08/15 - recommend he discontinue Plavix in 6 weeks. Will continue with Coumadin only after  this. - Discontinued statin secondary to hepatic steatosis and  statin related myalgias   3. Obesity- Morbid - concern for sleep apnea - [facility] & dietary counseling.  - Would benefit from sleep study as an outpatient. He cannot currently afford without insurance.  4.  Hyperlipidemia with target LDL less than 70  - significant myalgia symptoms have resolved having stopped taking statin. Hold statin for now,. Especially in light of severe hepatic steatosis.Marland Kitchen  - not a candidate for Zetia due to cost.   5. Hepatic steatosis - Likely related to him being severely obese. Needs to focus on heart healthy diet. Admits to eating a lot of sweets. Focus on weight loss.  6. Right iliac artery aneurysm: Should be monitored with outpatient Doppler or CT scan in one year. These aneurysms are not usually treated unless they get beyond 3 cm in diameter.   Current medicines are reviewed with the patient today.  The patient does not have concerns regarding medicines other than what has been noted above.  The following changes have been made:  See above.  Labs/ tests ordered today include:    No orders of the defined types were placed in this encounter.    Disposition:   FU with Dr. Swaziland in 6 months.   Patient is agreeable to this plan and will call if any problems develop in the interim.   Signed: Peter Swaziland MD,  [facility]    07/19/2016 8:15 AM  [facility]

## 2016-07-20 ENCOUNTER — Telehealth: Payer: Self-pay | Admitting: Cardiology

## 2016-07-20 ENCOUNTER — Ambulatory Visit: Payer: Self-pay | Admitting: Cardiology

## 2016-07-20 NOTE — Telephone Encounter (Signed)
Pt called to cxl today's appt-has the flu, also wants to know if blood work needed at next visit. He was prescribed Zetia by another doc and wants to know if Dr. SwazilandJordan would agree to start refilling it?

## 2016-07-23 MED ORDER — EZETIMIBE 10 MG PO TABS
10.0000 mg | ORAL_TABLET | Freq: Every day | ORAL | 6 refills | Status: DC
Start: 2016-07-23 — End: 2016-08-27

## 2016-07-23 MED FILL — **ZETIA 10MG TABLET: 10 MG | 30 days supply | Qty: 30 | Fill #0

## 2016-07-23 NOTE — Telephone Encounter (Signed)
Returned call to patient he stated he has the flu and had to reschedule appointment with Dr.Jordan this week.Stated he wanted to let Dr.Jordan know he has been having numbness in both legs for the past 4 months.Stated he is not able to walk.He also has noticed he gets sob with the least exertion.No swelling noticed.Stated he needed a refill for Zetia.Advised I will send in Zetia refill.Advised I will speak to Dr.Jordan tomorrow and call back with his recommendations.Advised I will send message to coumadin clinic and let them know you have the flu and need to reschedule INR appointment.

## 2016-07-23 NOTE — Telephone Encounter (Signed)
Spoke to patient and moved Coumadin appt to next week as he has the flu. He states he has enough Coumadin to last him until then. Will add note that needs refill sent to appt notes. Pt states appreciation.

## 2016-07-24 NOTE — Telephone Encounter (Signed)
Returned call to patient left message on personal voice mail I spoke to Dr.Jordan he advised to schedule sooner appointment.Advised to call me back to schedule.

## 2016-07-28 NOTE — Telephone Encounter (Signed)
Patient has appointment with Dr.Jordan 08/18/16 at 8:00 am.

## 2016-07-30 ENCOUNTER — Ambulatory Visit (INDEPENDENT_AMBULATORY_CARE_PROVIDER_SITE_OTHER): Payer: Self-pay | Admitting: *Deleted

## 2016-07-30 DIAGNOSIS — I2699 Other pulmonary embolism without acute cor pulmonale: Secondary | ICD-10-CM

## 2016-07-30 DIAGNOSIS — Z7901 Long term (current) use of anticoagulants: Secondary | ICD-10-CM

## 2016-07-30 DIAGNOSIS — I82441 Acute embolism and thrombosis of right tibial vein: Secondary | ICD-10-CM

## 2016-07-30 LAB — POCT INR: INR: 4.1

## 2016-07-30 MED ORDER — WARFARIN SODIUM 5 MG PO TABS: 5.0000 mg | ORAL_TABLET | Freq: Every day | ORAL | 3 refills | Status: DC

## 2016-08-10 ENCOUNTER — Telehealth: Payer: Self-pay | Admitting: Cardiology

## 2016-08-10 NOTE — Telephone Encounter (Signed)
Returned call to patient he stated last Tuesday 08/04/16 he twisted right ankle.[facility] was prescribed prednisone taper and tramadol.He wanted to know if he needs inr done sooner.He has inr appointment 08/13/16.Message sent to coumadin clinic

## 2016-08-10 NOTE — Telephone Encounter (Signed)
Returned call to pt, rx Prednisone taper and Tramadol on Friday 08/07/16 when [facility], started on Prednsione taper 08/07/16 6 tabs x 1, 5 tabs x 1, 4 tabs x 1, 3 tabs x 1, 2 tabs x 1, 1 tab x 1, then off.  Made pt an appt for tomorrow 08/11/16 at 11:30am.

## 2016-08-10 NOTE — Telephone Encounter (Signed)
Patient requesting a call back, would not go into details.Thanks

## 2016-08-11 ENCOUNTER — Ambulatory Visit (INDEPENDENT_AMBULATORY_CARE_PROVIDER_SITE_OTHER): Payer: Self-pay | Admitting: Pharmacist

## 2016-08-11 DIAGNOSIS — I82441 Acute embolism and thrombosis of right tibial vein: Secondary | ICD-10-CM

## 2016-08-11 DIAGNOSIS — I2699 Other pulmonary embolism without acute cor pulmonale: Secondary | ICD-10-CM

## 2016-08-11 DIAGNOSIS — Z7901 Long term (current) use of anticoagulants: Secondary | ICD-10-CM

## 2016-08-11 LAB — POCT INR: INR: 2.3

## 2016-08-17 NOTE — Progress Notes (Signed)
CARDIOLOGY OFFICE NOTE  Date:  08/18/2016    Denny Peon Date of Birth: 1956/04/07 Medical Record #[id number]  PCP:  Jaclyn Shaggy, MD  Cardiologist:  Peter Swaziland  MD  Chief Complaint  Patient presents with  . Coronary Artery Disease    History of Present Illness: Cory Nolan is a 61 y.o. male who presents today for follow up CAD and PE.   He has a history of morbid obesity, hyperlipidemia, type 2 DM, and CAD status post DES to mid to distal RCA on 03/08/2015. He was discharged on aspirin, Brilinta and high-dose statin.   He was admitted on 03/13/2015 with  multiple bilateral pulmonary emboli including saddle embolus at the bifurcation of right pulmonary artery.   Limited echocardiogram was obtained on 9/21 as well, EF is 55-60%, no regional wall motion abnormality, mildly dilated aortic root at 40 mm, poor caustic window reduces accuracy of RV assessment, RV is upper limits of normal to mildly dilated, PA peak pressure 48. Lower extremity venous Doppler showed evidence of DVT in the right posterior tibial vein. Patient was started on Coumadin bridging with Heparin. 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 After discussing with Dr. Allyson Sabal, this should be followed up as outpatient yearly via either Doppler or CT scan, and it is usually not treated unless it is beyond 3 cm in diameter.  He is intolerant of statins due to myalgias and hepatic steatosis. He is on Zetia with a 45 point drop in LDL.  On follow up today he is doing OK. He did have the flu this year. He twisted his ankle and apparently had some hemorrhage in the joint.  He denies any chest pain or SOB. Minimal edema. 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 States he could no longer walk them because his legs would get numb with walking and he couldn't trust his legs.  He admits he needs to lose weight. He is depressed. Followed in the [facility] health clinic.  Past Medical History:    Diagnosis Date  . Bilateral pulmonary embolism, multiple 03/13/15 03/08/15  . Coronary artery disease 03/08/15  . Diabetes mellitus type 2 in obese (HCC)   . Diarrhea 03/15/2015  . Dyslipidemia   . Morbid obesity (HCC)   . S/P angioplasty with stent-RCA prox and distal-DES 03/06/15 03/08/15    Past Surgical History:  Procedure Laterality Date  . CARDIAC CATHETERIZATION N/A 03/08/2015   Procedure: Left Heart Cath and Coronary Angiography;  Surgeon: Peter M Swaziland, MD;  Location: [facility] INVASIVE CV LAB;  Service: Cardiovascular;  Laterality: N/A;  . KNEE SURGERY       Medications: Current Outpatient Prescriptions  Medication Sig Dispense Refill  . acetaminophen (TYLENOL) 650 MG CR tablet Take 1,300 mg by mouth daily.    Marland Kitchen buPROPion (WELLBUTRIN SR) 150 MG 12 hr tablet Take 1 tablet (150 mg total) by mouth 2 (two) times daily. 60 tablet 2  . ezetimibe (ZETIA) 10 MG tablet Take 1 tablet (10 mg total) by mouth daily. 30 tablet 6  . Glucosamine HCl (GLUCOSAMINE PO) Take 1 capsule by mouth daily.    . metoprolol succinate (TOPROL-XL) 25 MG 24 hr tablet TAKE 1 TABLET BY MOUTH DAILY. 30 tablet 2  . nitroGLYCERIN (NITROSTAT) 0.4 MG SL tablet Place 1 tablet (0.4 mg total) under the tongue every 5 (five) minutes x 3 doses as needed for chest pain. 25 tablet 3  . omega-3 acid  ethyl esters (LOVAZA) 1 G capsule Take 1 capsule (1 g total) by mouth 2 (two) times daily. 30 capsule 11  . Powders (ANTI MONKEY BUTT EX) Apply 1 application topically daily as needed (friction burns on upper legs). Reported on 08/30/2015    . PRESCRIPTION MEDICATION Place 3-4 drops into the left eye 5 (five) times daily as needed (dry eyes). Reported on 08/30/2015    . ranitidine (ZANTAC) 150 MG tablet Take 150-300 mg by mouth daily as needed for heartburn (also take with naproxen).     Marland Kitchen warfarin (COUMADIN) 5 MG tablet Take 1-2 tablets (5-10 mg total) by mouth daily. 60 tablet 3   No current facility-administered medications for this  visit.     Allergies: No Known Allergies  Social History: The patient  reports that he quit smoking about 9 years ago. His smokeless tobacco use includes Chew. He reports that he drinks alcohol.   Family History: The patient's family history includes Bone cancer in his mother; Breast cancer in his mother; Dementia in his father; Heart attack in his mother; Heart disease in his mother.   Review of Systems: Please see the history of present illness.   Otherwise, the review of systems is positive for none.   All other systems are reviewed and negative.   Physical Exam: VS:  BP 104/72   Pulse 88   Ht 6\' 3"  (1.905 m)   Wt (!) 354 lb 12.8 oz (160.9 kg)   BMI 44.35 kg/m  .  BMI Body mass index is 44.35 kg/m.  Wt Readings from Last 3 Encounters:  08/18/16 (!) 354 lb 12.8 oz (160.9 kg)  01/21/16 (!) 343 lb 6.4 oz (155.8 kg)  08/30/15 (!) 339 lb 9.6 oz (154 kg)    General: Pleasant. He is morbidly obese but in no acute distress. HEENT: Normal but with deformity of the left eye.  Neck: Supple, no JVD, carotid bruits, or masses noted.  Cardiac: Regular rate and rhythm. No murmurs, rubs, or gallops. No edema.  Respiratory:  Lungs are clear to auscultation bilaterally with normal work of breathing.  GI: Soft and nontender. Obese.  MS: No deformity or atrophy. Gait and ROM intact.  Skin: Warm and dry. Color is normal.  Neuro:  Strength and sensation are intact and no gross focal deficits noted.  Psych: Alert, appropriate and with normal affect.   LABORATORY DATA:  EKG:  EKG is ordered today. NSR with old inferior infarct. Low voltage. I have personally reviewed and interpreted this study.   Lab Results  Component Value Date   WBC 6.6 03/27/2015   HGB 14.0 03/27/2015   HCT 39.5 03/27/2015   PLT 285 03/27/2015   GLUCOSE 113 (H) 01/13/2016   CHOL 189 01/13/2016   TRIG 214 (H) 01/13/2016   HDL 32 (L) 01/13/2016   LDLCALC 114 01/13/2016   ALT 32 01/13/2016   AST 20 01/13/2016     NA 137 01/13/2016   K 4.5 01/13/2016   CL 102 01/13/2016   CREATININE 0.71 01/13/2016   BUN 13 01/13/2016   CO2 26 01/13/2016   INR 2.3 08/11/2016   HGBA1C 6 08/30/2015   Lab Results  Component Value Date   INR 2.3 08/11/2016   INR 4.1 07/30/2016   INR 3.1 06/11/2016     BNP (last 3 results) No results for input(s): BNP in the last 8760 hours.  ProBNP (last 3 results) No results for input(s): PROBNP in the last 8760 hours.   Other Studies  Reviewed Today: none      Assessment/Plan: 1. Bilateral pulmonary embolism, multiple 03/13/15 (Acute DVT of right tibial vein): - he is on coumadin  Adjusted by pharmacy today.  No problems noted. We discussed guidelines suggesting 6 months of anticoagulation but he is very concerned about his risk for recurrent events and wants to take coumadin long term.  2. CAD S/P angioplasty with stent-RCA prox and distal-DES 03/08/15 - off antiplatelet therapy now since on coumadin.  - Discontinued statin secondary to hepatic steatosis and  statin related myalgias   3. Obesity- Morbid - concern for sleep apnea - [facility] & dietary counseling.  - Would benefit from sleep study as an outpatient. He cannot currently afford without insurance.  4.  Hyperlipidemia with target LDL less than 70  - significant myalgia symptoms have resolved having stopped taking statin. Hold statin for now,. Especially in light of severe hepatic steatosis.Marland Kitchen.  - will continue Zetia. Not a candidate for PCSK 9 due to cost.  5. Hepatic steatosis - Likely related to him being severely obese. Needs to focus on heart healthy diet. Admits to eating a lot of sweets. Focus on weight loss.  6. Right iliac artery aneurysm: some atypical leg numbness and weakness with walking. Will schedule for LE arterial dopplers and follow US for iliac aneurysm.   Current medicines are reviewed with the patient today.  The patient does not have concerns regarding medicines other than  what has been noted above.  The following changes have been made:  See above.  Labs/ tests ordered today include:    Orders Placed This Encounter  Procedures  . EKG 12-Lead     Disposition:   FU with Dr. SwazilandJordan in 6 months.   Patient is agreeable to this plan and will call if any problems develop in the interim.   Signed: Peter SwazilandJordan MD, [facility]    08/18/2016 10:53 AM  [facility]

## 2016-08-18 ENCOUNTER — Ambulatory Visit (INDEPENDENT_AMBULATORY_CARE_PROVIDER_SITE_OTHER): Payer: Self-pay | Admitting: Cardiology

## 2016-08-18 ENCOUNTER — Ambulatory Visit: Payer: Self-pay | Admitting: Cardiology

## 2016-08-18 ENCOUNTER — Encounter: Payer: Self-pay | Admitting: Cardiology

## 2016-08-18 VITALS — BP 104/72 | HR 88 | Ht 75.0 in | Wt 354.8 lb

## 2016-08-18 DIAGNOSIS — I2699 Other pulmonary embolism without acute cor pulmonale: Secondary | ICD-10-CM

## 2016-08-18 DIAGNOSIS — Z9861 Coronary angioplasty status: Secondary | ICD-10-CM

## 2016-08-18 DIAGNOSIS — I723 Aneurysm of iliac artery: Secondary | ICD-10-CM

## 2016-08-18 DIAGNOSIS — Z7901 Long term (current) use of anticoagulants: Secondary | ICD-10-CM

## 2016-08-18 DIAGNOSIS — I2510 Atherosclerotic heart disease of native coronary artery without angina pectoris: Secondary | ICD-10-CM

## 2016-08-18 DIAGNOSIS — I739 Peripheral vascular disease, unspecified: Secondary | ICD-10-CM

## 2016-08-18 NOTE — Patient Instructions (Signed)
We will schedule you for doppler of your legs to check the aneurysm and circulation to your legs.   I will see you in 6 months.

## 2016-08-24 MED FILL — ?WARFARIN SODIUM 5 MG TABLE: 5 | 30 days supply | Qty: 60 | Fill #0

## 2016-08-27 ENCOUNTER — Other Ambulatory Visit: Payer: Self-pay

## 2016-08-27 MED ORDER — EZETIMIBE 10 MG PO TABS: 10.0000 mg | ORAL_TABLET | Freq: Every day | ORAL | 3 refills | Status: DC

## 2016-09-02 ENCOUNTER — Ambulatory Visit (HOSPITAL_COMMUNITY): Admission: EM | Admit: 2016-09-02 | Discharge: 2016-09-02 | Payer: Self-pay

## 2016-09-02 ENCOUNTER — Emergency Department (HOSPITAL_COMMUNITY)
Admission: EM | Admit: 2016-09-02 | Discharge: 2016-09-03 | Disposition: A | Discharge status: Home / Self Care | Payer: Self-pay | Attending: Emergency Medicine | Admitting: Emergency Medicine

## 2016-09-02 ENCOUNTER — Emergency Department (HOSPITAL_COMMUNITY): Payer: Self-pay

## 2016-09-02 ENCOUNTER — Encounter (HOSPITAL_COMMUNITY): Payer: Self-pay | Admitting: *Deleted

## 2016-09-02 DIAGNOSIS — I723 Aneurysm of iliac artery: Secondary | ICD-10-CM | POA: Insufficient documentation

## 2016-09-02 DIAGNOSIS — Z955 Presence of coronary angioplasty implant and graft: Secondary | ICD-10-CM | POA: Insufficient documentation

## 2016-09-02 DIAGNOSIS — I2510 Atherosclerotic heart disease of native coronary artery without angina pectoris: Secondary | ICD-10-CM | POA: Insufficient documentation

## 2016-09-02 DIAGNOSIS — Z87891 Personal history of nicotine dependence: Secondary | ICD-10-CM | POA: Insufficient documentation

## 2016-09-02 DIAGNOSIS — Z7901 Long term (current) use of anticoagulants: Secondary | ICD-10-CM | POA: Insufficient documentation

## 2016-09-02 DIAGNOSIS — E119 Type 2 diabetes mellitus without complications: Secondary | ICD-10-CM | POA: Insufficient documentation

## 2016-09-02 LAB — CBC
HEMATOCRIT: 42 % (ref 39.0–52.0)
Hemoglobin: 13.9 g/dL (ref 13.0–17.0)
MCH: 29.1 pg (ref 26.0–34.0)
MCHC: 33.1 g/dL (ref 30.0–36.0)
MCV: 87.9 fL (ref 78.0–100.0)
Platelets: 201 10*3/uL (ref 150–400)
RBC: 4.78 MIL/uL (ref 4.22–5.81)
RDW: 14 % (ref 11.5–15.5)
WBC: 6.5 10*3/uL (ref 4.0–10.5)

## 2016-09-02 LAB — URINALYSIS, ROUTINE W REFLEX MICROSCOPIC
BILIRUBIN URINE: NEGATIVE
Glucose, UA: NEGATIVE mg/dL
HGB URINE DIPSTICK: NEGATIVE
KETONES UR: NEGATIVE mg/dL
Leukocytes, UA: NEGATIVE
NITRITE: NEGATIVE
PROTEIN: NEGATIVE mg/dL
SPECIFIC GRAVITY, URINE: 1.025 (ref 1.005–1.030)
pH: 6 (ref 5.0–8.0)

## 2016-09-02 LAB — COMPREHENSIVE METABOLIC PANEL
ALBUMIN: 3.7 g/dL (ref 3.5–5.0)
ALK PHOS: 65 U/L (ref 38–126)
ALT: 56 U/L (ref 17–63)
AST: 38 U/L (ref 15–41)
Anion gap: 8 (ref 5–15)
BILIRUBIN TOTAL: 1.2 mg/dL (ref 0.3–1.2)
BUN: 7 mg/dL (ref 6–20)
CO2: 27 mmol/L (ref 22–32)
Calcium: 8.9 mg/dL (ref 8.9–10.3)
Chloride: 100 mmol/L — ABNORMAL LOW (ref 101–111)
Creatinine, Ser: 0.89 mg/dL (ref 0.61–1.24)
GFR calc Af Amer: >60 mL/min (ref >60)
GFR calc non Af Amer: >60 mL/min (ref >60)
GLUCOSE: 158 mg/dL — AB (ref 65–99)
POTASSIUM: 4.2 mmol/L (ref 3.5–5.1)
Sodium: 135 mmol/L (ref 135–145)
TOTAL PROTEIN: 6.6 g/dL (ref 6.5–8.1)

## 2016-09-02 LAB — PROTIME-INR
INR: 3.28
PROTHROMBIN TIME: 34.1 s — AB (ref 11.4–15.2)

## 2016-09-02 LAB — LIPASE, BLOOD: Lipase: 37 U/L (ref 11–51)

## 2016-09-02 MED ORDER — IOPAMIDOL (ISOVUE-300) INJECTION 61%
INTRAVENOUS | Status: AC
  Administered 2016-09-02: 100 mL
  Filled 2016-09-02: qty 100

## 2016-09-02 NOTE — ED Triage Notes (Signed)
Pt reports dull RLQ x 2 weeks but more severe now. Denies n/v or fever.

## 2016-09-02 NOTE — Discharge Instructions (Signed)
Your CT scan shows an aneurysm. Please see the vascular doctor - it likely needs to be monitored.

## 2016-09-02 NOTE — ED Provider Notes (Signed)
[facility]-EMERGENCY DEPT Provider Note   CSN: [id number] Arrival date & time: 09/02/16  1558     History   Chief Complaint Chief Complaint  Patient presents with  . Abdominal Pain    HPI HASHEEM VOLAND is a 61 y.o. male.  HPI Pt comes in with cc of abd paim. Pt has hx of PE, on coumadin, and DM, HL, CAD. Pt reports that he has been having abd pain x 2 months. The pain is described as dull, mild to moderate in severity. Pt reports that for the last 2 weeks the pain has been constant and the severity has gone up.+ nausea and emesis x 2 over the last week. No diarrhea, BM have been regular. Pt has no fevers, uti like symptoms. No abd surgery.  Past Medical History:  Diagnosis Date  . Bilateral pulmonary embolism, multiple 03/13/15 03/08/15  . Coronary artery disease 03/08/15  . Diabetes mellitus type 2 in obese (HCC)   . Diarrhea 03/15/2015  . Dyslipidemia   . Morbid obesity (HCC)   . S/P angioplasty with stent-RCA prox and distal-DES 03/06/15 03/08/15    Patient Active Problem List   Diagnosis Date Noted  . Depression 08/30/2015  . Long term (current) use of anticoagulants [Z79.01] 03/27/2015  . Morbid obesity (HCC) 03/25/2015  . Hepatic steatosis 03/19/2015  . Iliac artery aneurysm, right (HCC) 03/19/2015  . Statin intolerance - significant myalgias with high-dose Lipitor; severe steatohepatitis 03/19/2015  . Prediabetes 03/18/2015  . Hyperlipidemia with target LDL less than 70 03/18/2015  . Acute DVT of right tibial vein (HCC) 03/18/2015  . Bilateral pulmonary embolism, multiple 03/13/15 03/15/2015  . S/P angioplasty with stent-RCA prox and distal-DES 03/06/15 03/15/2015  . Diarrhea 03/15/2015  . Obesity 03/13/2015  . Chest pain 03/13/2015  . Dyspnea 03/08/2015  . SOB (shortness of breath) 03/08/2015  . CAD S/P PCI with DES to RCA 03/08/15 03/08/2015  . History of Unstable Angina     Past Surgical History:  Procedure Laterality Date  . CARDIAC CATHETERIZATION N/A  03/08/2015   Procedure: Left Heart Cath and Coronary Angiography;  Surgeon: Peter M Swaziland, MD;  Location: [facility] INVASIVE CV LAB;  Service: Cardiovascular;  Laterality: N/A;  . KNEE SURGERY         Home Medications    Prior to Admission medications   Medication Sig Start Date End Date Taking? Authorizing Provider  acetaminophen (TYLENOL) 650 MG CR tablet Take 1,300 mg by mouth every 8 (eight) hours as needed for pain.    Yes Historical Provider, MD  ezetimibe (ZETIA) 10 MG tablet Take 1 tablet (10 mg total) by mouth daily. 08/27/16  Yes Jaclyn Shaggy, MD  Glucosamine HCl (GLUCOSAMINE PO) Take 1 capsule by mouth daily.   Yes Historical Provider, MD  hydrocortisone cream 1 % Apply 1 application topically as needed for itching.   Yes Historical Provider, MD  metoprolol succinate (TOPROL-XL) 25 MG 24 hr tablet TAKE 1 TABLET BY MOUTH DAILY. Patient taking differently: TAKE 0.5 TABLET BY MOUTH DAILY. 02/04/16  Yes Jaclyn Shaggy, MD  Omega-3 Fatty Acids (FISH OIL PO) Take 1 capsule by mouth at bedtime.   Yes Historical Provider, MD  warfarin (COUMADIN) 5 MG tablet Take 1-2 tablets (5-10 mg total) by mouth daily. Patient taking differently: Take 5-10 mg by mouth See admin instructions. Take 5 mg by mouth daily on Monday and Friday. Take 10 mg by mouth daily on all other days. 07/30/16  Yes Peter M Swaziland, MD  buPROPion [facility] SR)  150 MG 12 hr tablet Take 1 tablet (150 mg total) by mouth 2 (two) times daily. Patient not taking: Reported on 09/02/2016 08/30/15   Jaclyn ShaggyEnobong Amao, MD  nitroGLYCERIN (NITROSTAT) 0.4 MG SL tablet Place 1 tablet (0.4 mg total) under the tongue every 5 (five) minutes x 3 doses as needed for chest pain. 03/22/15   Azalee CourseHao Meng, PA  omega-3 acid ethyl esters (LOVAZA) 1 G capsule Take 1 capsule (1 g total) by mouth 2 (two) times daily. Patient not taking: Reported on 09/02/2016 03/22/15   Azalee CourseHao Meng, PA    Family History Family History  Problem Relation Age of Onset  . Dementia Father   .  Breast cancer Mother   . Bone cancer Mother   . Heart disease Mother   . Heart attack Mother     Social History Social History  Substance Use Topics  . Smoking status: Former Smoker    Quit date: 07/12/2007  . Smokeless tobacco: Current User    Types: Chew  . Alcohol use 0.0 oz/week     Comment: occ     Allergies   Patient has no known allergies.   Review of Systems Review of Systems  Gastrointestinal: Positive for abdominal distention, abdominal pain, nausea and vomiting.    ROS 10 Systems reviewed and are negative for acute change except as noted in the HPI.     Physical Exam Updated Vital Signs BP 108/78   Pulse 74   Temp 98.3 F (36.8 C) (Oral)   Resp 18   SpO2 94%   Physical Exam  Constitutional: He is oriented to person, place, and time. He appears well-developed.  HENT:  Head: Normocephalic and atraumatic.  Eyes: Conjunctivae and EOM are normal. Pupils are equal, round, and reactive to light.  Neck: Normal range of motion. Neck supple.  Cardiovascular: Normal rate and regular rhythm.   Pulmonary/Chest: Effort normal and breath sounds normal.  Abdominal: Soft. Bowel sounds are normal. He exhibits no distension and no mass. There is tenderness. There is guarding. There is no rebound.  Pt has lower quad tenderness, + Mc Burneys  Musculoskeletal: He exhibits no deformity.  Neurological: He is alert and oriented to person, place, and time.  Skin: Skin is warm.  Nursing note and vitals reviewed.    ED Treatments / Results  Labs (all labs ordered are listed, but only abnormal results are displayed) Labs Reviewed  COMPREHENSIVE METABOLIC PANEL - Abnormal; Notable for the following:       Result Value   Chloride 100 (*)    Glucose, Bld 158 (*)    All other components within normal limits  LIPASE, BLOOD  CBC  URINALYSIS, ROUTINE W REFLEX MICROSCOPIC  PROTIME-INR    EKG  EKG Interpretation None       Radiology No results  found.  Procedures Procedures (including critical care time)  Medications Ordered in ED Medications - No data to display   Initial Impression / Assessment and Plan / ED Course  I have reviewed the triage vital signs and the nursing notes.  Pertinent labs & imaging results that were available during my care of the patient were reviewed by me and considered in my medical decision making (see chart for details).     Pt comes in with cc of abd pain. Abd pain is R sided, progressive, now constant and severe. He is diabetic and > [age] years of age. CT abd ordered.   Final Clinical Impressions(s) / ED Diagnoses   Final  diagnoses:  None    New Prescriptions New Prescriptions   No medications on file     Derwood Kaplan, MD 09/02/16 2048

## 2016-09-03 NOTE — ED Notes (Signed)
Patient able to ambulate independently  

## 2016-09-08 ENCOUNTER — Ambulatory Visit (INDEPENDENT_AMBULATORY_CARE_PROVIDER_SITE_OTHER): Payer: Self-pay

## 2016-09-08 DIAGNOSIS — I2699 Other pulmonary embolism without acute cor pulmonale: Secondary | ICD-10-CM

## 2016-09-08 DIAGNOSIS — I82441 Acute embolism and thrombosis of right tibial vein: Secondary | ICD-10-CM

## 2016-09-08 DIAGNOSIS — Z7901 Long term (current) use of anticoagulants: Secondary | ICD-10-CM

## 2016-09-08 LAB — POCT INR: INR: 3.4

## 2016-09-09 ENCOUNTER — Telehealth: Payer: Self-pay

## 2016-09-09 ENCOUNTER — Other Ambulatory Visit: Payer: Self-pay

## 2016-09-09 MED ORDER — EZETIMIBE 10 MG PO TABS: 10.0000 mg | ORAL_TABLET | Freq: Every day | ORAL | 6 refills | Status: AC

## 2016-09-09 NOTE — Telephone Encounter (Signed)
Spoke to patient he stated he wanted to ask pharmacist if ok to take Melatoin with Coumadin.Advised I will send message to coumadin clinic.

## 2016-09-09 NOTE — Telephone Encounter (Signed)
No issue with pt taking Coumadin and melatonin together. Pt is aware he can take both.

## 2016-09-11 ENCOUNTER — Encounter (HOSPITAL_COMMUNITY): Payer: Self-pay

## 2016-09-11 ENCOUNTER — Other Ambulatory Visit (HOSPITAL_COMMUNITY): Payer: Self-pay

## 2016-09-16 ENCOUNTER — Other Ambulatory Visit: Payer: Self-pay | Admitting: Cardiology

## 2016-09-16 DIAGNOSIS — I739 Peripheral vascular disease, unspecified: Secondary | ICD-10-CM

## 2016-09-21 MED FILL — ?WARFARIN SODIUM 5 MG TABLE: 5 | 30 days supply | Qty: 60 | Fill #1

## 2016-09-23 ENCOUNTER — Ambulatory Visit (HOSPITAL_COMMUNITY)
Admission: RE | Admit: 2016-09-23 | Discharge: 2016-09-23 | Disposition: A | Discharge status: Home / Self Care | Payer: Self-pay | Source: Ambulatory Visit | Attending: Cardiology | Admitting: Cardiology

## 2016-09-23 ENCOUNTER — Other Ambulatory Visit (HOSPITAL_COMMUNITY): Payer: Self-pay

## 2016-09-23 DIAGNOSIS — I739 Peripheral vascular disease, unspecified: Secondary | ICD-10-CM | POA: Insufficient documentation

## 2016-09-23 DIAGNOSIS — I723 Aneurysm of iliac artery: Secondary | ICD-10-CM

## 2016-09-23 DIAGNOSIS — Z87891 Personal history of nicotine dependence: Secondary | ICD-10-CM | POA: Insufficient documentation

## 2016-09-23 DIAGNOSIS — I2510 Atherosclerotic heart disease of native coronary artery without angina pectoris: Secondary | ICD-10-CM | POA: Insufficient documentation

## 2016-09-23 DIAGNOSIS — I2699 Other pulmonary embolism without acute cor pulmonale: Secondary | ICD-10-CM

## 2016-09-23 DIAGNOSIS — Z7901 Long term (current) use of anticoagulants: Secondary | ICD-10-CM

## 2016-09-23 DIAGNOSIS — Z9861 Coronary angioplasty status: Secondary | ICD-10-CM

## 2016-09-23 DIAGNOSIS — E785 Hyperlipidemia, unspecified: Secondary | ICD-10-CM | POA: Insufficient documentation

## 2016-09-29 ENCOUNTER — Other Ambulatory Visit: Payer: Self-pay

## 2016-09-29 ENCOUNTER — Ambulatory Visit (INDEPENDENT_AMBULATORY_CARE_PROVIDER_SITE_OTHER): Payer: Self-pay

## 2016-09-29 DIAGNOSIS — I82441 Acute embolism and thrombosis of right tibial vein: Secondary | ICD-10-CM

## 2016-09-29 DIAGNOSIS — I2699 Other pulmonary embolism without acute cor pulmonale: Secondary | ICD-10-CM

## 2016-09-29 DIAGNOSIS — Z7901 Long term (current) use of anticoagulants: Secondary | ICD-10-CM

## 2016-09-29 LAB — POCT INR: INR: 2.6

## 2016-09-29 MED ORDER — METOPROLOL SUCCINATE ER 25 MG PO TB24: 25.0000 mg | ORAL_TABLET | Freq: Every day | ORAL | 2 refills | Status: DC

## 2016-09-29 MED FILL — METOPROLOL SUCC ER 25 MG TA: 25 | 30 days supply | Qty: 30 | Fill #0

## 2016-09-29 MED FILL — ?EZETIMIBE 10 MG TAB: 10 | 90 days supply | Qty: 90 | Fill #1

## 2016-09-29 NOTE — Telephone Encounter (Signed)
Pt seen in Coumadin Clinic requesting refill on Metoprolol to [facility].  Thanks

## 2016-09-29 NOTE — Telephone Encounter (Signed)
REFILL 

## 2016-10-22 NOTE — Progress Notes (Signed)
CARDIOLOGY OFFICE NOTE  Date:  10/23/2016    Cory Nolan Date of Birth: July 31, 1955 Medical Record #[id number]  PCP:  Jaclyn Shaggy, MD  Cardiologist:  Jimi Schappert [place]  MD  Chief Complaint  Patient presents with  . Coronary Artery Disease    History of Present Illness: Cory Nolan is a 61 y.o. male who presents today for follow up CAD and PE.   He has a history of morbid obesity, hyperlipidemia, type 2 DM, and CAD status post DES to mid to distal RCA on 03/08/2015. He was discharged on aspirin, Brilinta and high-dose statin.   He was admitted on 03/13/2015 with  multiple bilateral pulmonary emboli including saddle embolus at the bifurcation of right pulmonary artery.   Limited echocardiogram was obtained on 9/21 as well, EF is 55-60%, no regional wall motion abnormality, mildly dilated aortic root at 40 mm, poor caustic window reduces accuracy of RV assessment, RV is upper limits of normal to mildly dilated, PA peak pressure 48. Lower extremity venous Doppler showed evidence of DVT in the right posterior tibial vein. Patient was started on Coumadin bridging with Heparin. 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 He is intolerant of statins due to myalgias and hepatic steatosis. He is on Zetia with a 45 point drop in LDL.  On follow up today he is doing OK. He was seen in the ED in March with abdominal pain. CT abd/pelvis showed a fatty liver. AAA at 3.1 cm. Right iliac aneurysm 2.7 cm. This was stable. No clear reason for his abdominal pain.  He denies any chest pain or SOB. Minimal edema.  He is trying to improve his conditioning. He is walking more and started using a stationary bike last week. Hasn't lost weight- admits to having an oral fixation. Has purchased a place in [place] with plans to eventually move there. Is scheduled to see primary care in June.   Past Medical History:  Diagnosis Date  . Bilateral pulmonary embolism, multiple  03/13/15 03/08/15  . Coronary artery disease 03/08/15  . Diabetes mellitus type 2 in obese (HCC)   . Diarrhea 03/15/2015  . Dyslipidemia   . Morbid obesity (HCC)   . S/P angioplasty with stent-RCA prox and distal-DES 03/06/15 03/08/15    Past Surgical History:  Procedure Laterality Date  . CARDIAC CATHETERIZATION N/A 03/08/2015   Procedure: Left Heart Cath and Coronary Angiography;  Surgeon: Lakishia Bourassa M Swaziland, MD;  Location: [facility] INVASIVE CV LAB;  Service: Cardiovascular;  Laterality: N/A;  . KNEE SURGERY       Medications: Current Outpatient Prescriptions  Medication Sig Dispense Refill  . acetaminophen (TYLENOL) 650 MG CR tablet Take 1,300 mg by mouth every 8 (eight) hours as needed for pain.     Marland Kitchen ezetimibe (ZETIA) 10 MG tablet Take 1 tablet (10 mg total) by mouth daily. 30 tablet 6  . Glucosamine HCl (GLUCOSAMINE PO) Take 1 capsule by mouth daily.    . hydrocortisone cream 1 % Apply 1 application topically as needed for itching.    . metoprolol succinate (TOPROL-XL) 25 MG 24 hr tablet Take 1 tablet (25 mg total) by mouth daily. 30 tablet 2  . nitroGLYCERIN (NITROSTAT) 0.4 MG SL tablet Place 1 tablet (0.4 mg total) under the tongue every 5 (five) minutes x 3 doses as needed for chest pain. 25 tablet 3  . omega-3 acid ethyl esters (LOVAZA) 1 G capsule Take 1 capsule (1  g total) by mouth 2 (two) times daily. 30 capsule 11  . Omega-3 Fatty Acids (FISH OIL PO) Take 1 capsule by mouth at bedtime.    Marland Kitchen. warfarin (COUMADIN) 5 MG tablet Take 1-2 tablets (5-10 mg total) by mouth daily. (Patient taking differently: Take 5-10 mg by mouth See admin instructions. Take 5 mg by mouth daily on Monday and Friday. Take 10 mg by mouth daily on all other days.) 60 tablet 3   No current facility-administered medications for this visit.     Allergies: No Known Allergies  Social History: The patient  reports that he quit smoking about 9 years ago. His smokeless tobacco use includes Chew. He reports that he  drinks alcohol.   Family History: The patient's family history includes Bone cancer in his mother; Breast cancer in his mother; Dementia in his father; Heart attack in his mother; Heart disease in his mother.   Review of Systems: Please see the history of present illness. He has scaling of his feet. Still has some lower abdominal pain.   Otherwise, the review of systems is positive for none.   All other systems are reviewed and negative.   Physical Exam: VS:  BP 138/84   Pulse 68   Ht 6\' 3"  (1.905 m)   Wt (!) 355 lb 6.4 oz (161.2 kg)   BMI 44.42 kg/m  .  BMI Body mass index is 44.42 kg/m.  Wt Readings from Last 3 Encounters:  10/23/16 (!) 355 lb 6.4 oz (161.2 kg)  08/18/16 (!) 354 lb 12.8 oz (160.9 kg)  01/21/16 (!) 343 lb 6.4 oz (155.8 kg)    General: Pleasant. He is morbidly obese but in no acute distress. HEENT: Normal but with deformity of the left eye.  Neck: Supple, no JVD, carotid bruits, or masses noted.  Cardiac: Regular rate and rhythm. No murmurs, rubs, or gallops. No edema.  Respiratory:  Lungs are clear to auscultation bilaterally with normal work of breathing.  GI: Soft and nontender. Obese.  MS: No deformity or atrophy. Gait and ROM intact.  Skin: Warm and dry. Color is normal.  Neuro:  Strength and sensation are intact and no gross focal deficits noted.  Psych: Alert, appropriate and with normal affect.   LABORATORY DATA:   Lab Results  Component Value Date   WBC 6.5 09/02/2016   HGB 13.9 09/02/2016   HCT 42.0 09/02/2016   PLT 201 09/02/2016   GLUCOSE 158 (H) 09/02/2016   CHOL 189 01/13/2016   TRIG 214 (H) 01/13/2016   HDL 32 (L) 01/13/2016   LDLCALC 114 01/13/2016   ALT 56 09/02/2016   AST 38 09/02/2016   NA 135 09/02/2016   K 4.2 09/02/2016   CL 100 (L) 09/02/2016   CREATININE 0.89 09/02/2016   BUN 7 09/02/2016   CO2 27 09/02/2016   INR 2.6 09/29/2016   HGBA1C 6 08/30/2015   Lab Results  Component Value Date   INR 2.6 09/29/2016   INR  3.4 09/08/2016   INR 3.28 09/02/2016     BNP (last 3 results) No results for input(s): BNP in the last 8760 hours.  ProBNP (last 3 results) No results for input(s): PROBNP in the last 8760 hours.   Other Studies Reviewed Today: LE arterial dopplers 09/23/16 showed no significant obstruction.   EXAM: CT ABDOMEN AND PELVIS WITH CONTRAST  TECHNIQUE: Multidetector CT imaging of the abdomen and pelvis was performed using the standard protocol following bolus administration of intravenous contrast.  CONTRAST:  ISOVUE-300 IOPAMIDOL (ISOVUE-300) INJECTION 61%  COMPARISON:  03/18/2015  FINDINGS: Lower chest: The lung bases are clear. Coronary artery calcifications.  Hepatobiliary: Diffuse fatty infiltration of the liver. No focal lesions. Gallbladder and bile ducts are unremarkable.  Pancreas: Unremarkable. No pancreatic ductal dilatation or surrounding inflammatory changes.  Spleen: Normal in size without focal abnormality.  Adrenals/Urinary Tract: No adrenal gland nodules. Kidneys are symmetrical. Nephrograms are homogeneous. Subcentimeter cysts. No hydronephrosis or hydroureter. Bladder wall is not thickened.  Stomach/Bowel: Stomach is within normal limits. Appendix appears normal. No evidence of bowel wall thickening, distention, or inflammatory changes.  Vascular/Lymphatic: Mild aortic calcification. Inferior abdominal aortic ectasia, measuring 3.1 cm maximal diameter. Right iliac artery aneurysm measuring 2.7 cm diameter. No change since prior study. Inferior vena cava is unremarkable. No significant lymphadenopathy.  Reproductive: Prostate is unremarkable.  Other: Small umbilical hernia containing fat. No free air or free fluid in the abdomen.  Musculoskeletal: Degenerative changes in the spine. No destructive bone lesions.  IMPRESSION: Diffuse fatty infiltration of the liver. Aortic atherosclerosis. 2.7 cm diameter right iliac artery  aneurysm, unchanged. Small umbilical hernia containing fat. No acute process demonstrated in the abdomen or pelvis.   Electronically Signed   By: Burman Nieves M.D.   On: 09/02/2016 21:38  Assessment/Plan: 1. Bilateral pulmonary embolism, multiple 03/13/15 (Acute DVT of right tibial vein): - he is on coumadin   He is very concerned about his risk for recurrent events and wants to take coumadin long term.  2. CAD S/P angioplasty with stent-RCA prox and distal-DES 03/08/15- asymptomatic. - off antiplatelet therapy now since on coumadin.  - Discontinued statin secondary to hepatic steatosis and  statin related myalgias   3. Obesity- Morbid - concern for sleep apnea - [facility] & dietary counseling.  - Would benefit from sleep study as an outpatient. He cannot currently afford without insurance.  4.  Hyperlipidemia with target LDL less than 70  - significant myalgia symptoms have resolved having stopped taking statin. Hold statin for now,. Especially in light of severe hepatic steatosis.Marland Kitchen  - will continue Zetia. Not a candidate for [facility] 9 due to cost.  5. Hepatic steatosis - Likely related to him being severely obese. Needs to focus on heart healthy diet.   6. Right iliac artery aneurysm:  LE arterial dopplers looked good.  CT in March showed no change in AAA or right iliac aneurysm.    Current medicines are reviewed with the patient today.  The patient does not have concerns regarding medicines other than what has been noted above.  The following changes have been made:  See above.  Labs/ tests ordered today include:    No orders of the defined types were placed in this encounter.    Disposition:   FU with Dr. Swaziland in 6 months.   Patient is agreeable to this plan and will call if any problems develop in the interim.   Signed: Stanford Strauch [place] MD, [facility]    10/23/2016 8:51 AM  [facility]

## 2016-10-23 ENCOUNTER — Ambulatory Visit (INDEPENDENT_AMBULATORY_CARE_PROVIDER_SITE_OTHER): Payer: Self-pay | Admitting: Cardiology

## 2016-10-23 ENCOUNTER — Encounter: Payer: Self-pay | Admitting: Cardiology

## 2016-10-23 VITALS — BP 138/84 | HR 68 | Ht 75.0 in | Wt 355.4 lb

## 2016-10-23 DIAGNOSIS — I2699 Other pulmonary embolism without acute cor pulmonale: Secondary | ICD-10-CM

## 2016-10-23 DIAGNOSIS — I723 Aneurysm of iliac artery: Secondary | ICD-10-CM

## 2016-10-23 DIAGNOSIS — I739 Peripheral vascular disease, unspecified: Secondary | ICD-10-CM

## 2016-10-23 DIAGNOSIS — I2510 Atherosclerotic heart disease of native coronary artery without angina pectoris: Secondary | ICD-10-CM

## 2016-10-23 NOTE — Patient Instructions (Signed)
Continue your current therapy  I will see you in 6 months.   

## 2016-10-27 ENCOUNTER — Ambulatory Visit (INDEPENDENT_AMBULATORY_CARE_PROVIDER_SITE_OTHER): Payer: Self-pay | Admitting: *Deleted

## 2016-10-27 DIAGNOSIS — I2699 Other pulmonary embolism without acute cor pulmonale: Secondary | ICD-10-CM

## 2016-10-27 DIAGNOSIS — Z7901 Long term (current) use of anticoagulants: Secondary | ICD-10-CM

## 2016-10-27 DIAGNOSIS — I82441 Acute embolism and thrombosis of right tibial vein: Secondary | ICD-10-CM

## 2016-10-27 LAB — POCT INR: INR: 3

## 2016-11-03 ENCOUNTER — Other Ambulatory Visit: Payer: Self-pay | Admitting: Physician Assistant

## 2016-11-03 MED FILL — METOPROLOL SUCC ER 25 MG TA: 25 | 30 days supply | Qty: 30 | Fill #1

## 2016-11-03 MED FILL — ?WARFARIN SODIUM 5 MG TABLE: 5 | 30 days supply | Qty: 60 | Fill #2

## 2016-11-03 NOTE — Telephone Encounter (Signed)
Please review for refill. Thanks!  

## 2016-11-18 ENCOUNTER — Telehealth: Payer: Self-pay | Admitting: Emergency Medicine

## 2016-11-18 NOTE — Telephone Encounter (Signed)
    Mr. Cory Nolan called this morning to inform Dr. SwazilandJordan that he is having problems with bleeding. He has been on Coumadin since 2016 after bilateral PE and stent placement to mid/distal RCA. He developed constipation last week after a diet changing his diet and began to spot blood at that time. This morning he woke up with a significant amount of blood in his bed. While on the phone, he tells me he is actively bleeding and in the bath tub trying to clean up. I recommended calling EMS and going to the closest ER  especially since he lives alone. He says that money is a concern and that he is NOT going to go to the ER. His plan is to finish cleaning up, call his plumber to cancel an appointment, then drive [facility].   I did make him aware of the danger in driving under these conditions and the risk of worsening bleeding by not being seen emergently (syncope, worsening condition, death). He still refuses EMS/ER. He called to ask  Dr. SwazilandJordan be made aware of his bleeding complication.

## 2016-11-26 ENCOUNTER — Encounter: Payer: Self-pay | Admitting: Family Medicine

## 2016-11-26 ENCOUNTER — Ambulatory Visit (INDEPENDENT_AMBULATORY_CARE_PROVIDER_SITE_OTHER): Payer: Self-pay | Admitting: Family Medicine

## 2016-11-26 VITALS — BP 108/68 | HR 96 | Temp 98.7°F | Ht 73.0 in | Wt 351.2 lb

## 2016-11-26 DIAGNOSIS — R1031 Right lower quadrant pain: Secondary | ICD-10-CM | POA: Insufficient documentation

## 2016-11-26 DIAGNOSIS — K625 Hemorrhage of anus and rectum: Secondary | ICD-10-CM

## 2016-11-26 DIAGNOSIS — Z7901 Long term (current) use of anticoagulants: Secondary | ICD-10-CM

## 2016-11-26 LAB — CBC
HCT: 41.2 % (ref 39.0–52.0)
Hemoglobin: 13.8 g/dL (ref 13.0–17.0)
MCHC: 33.4 g/dL (ref 30.0–36.0)
MCV: 89 fl (ref 78.0–100.0)
Platelets: 222 10*3/uL (ref 150.0–400.0)
RBC: 4.63 Mil/uL (ref 4.22–5.81)
RDW: 14.6 % (ref 11.5–15.5)
WBC: 6.3 10*3/uL (ref 4.0–10.5)

## 2016-11-26 NOTE — Assessment & Plan Note (Signed)
Stopped Warfarin due to bleeding. Will discuss with cardiology.

## 2016-11-26 NOTE — Patient Instructions (Addendum)
Continue your medications.  I will talk with GI and Cardiology.  I will call you soon.  Take care  Dr. Adriana Simasook

## 2016-11-26 NOTE — Progress Notes (Signed)
Subjective:  Patient ID: Cory Nolan, male    DOB: 1956/04/15  Age: 61 y.o. MRN: [id number]  CC: Rectal bleeding, abdominal pain  HPI Cory Nolan is a 61 y.o. male presents to establish care. Current concerns are below.   Abdominal pain and rectal bleeding  RLQ.  X 8-9 months.   Seen in ED in March for this. CT unrevealing for cause.   Continues to have pain frequently.  Moderate in severity.   Has rectal bleeding as well.This has been worsening as of late. As a result of his rectal bleeding he stopped his warfarin.  He states that he has associated constipation. He has a mass which she presumes is a hemorrhoid.  He's quite concerned about his symptoms.  He is worried, however, about cost as he has no insurance.  No reports of weight loss.  No other associated symptoms. No other complaints  PMH, Surgical Hx, Family Hx, Social History reviewed and updated as below.  Past Medical History:  Diagnosis Date  . Bilateral pulmonary embolism, multiple 03/13/15 03/08/15  . Coronary artery disease 03/08/15  . Hepatic steatosis 03/19/2015  . Hyperlipidemia with target LDL less than 70 03/18/2015  . Iliac artery aneurysm, right (HCC) 03/19/2015  . Morbid obesity (HCC)   . Prediabetes 03/18/2015  . S/P angioplasty with stent-RCA prox and distal-DES 03/06/15 03/08/15   Past Surgical History:  Procedure Laterality Date  . CARDIAC CATHETERIZATION N/A 03/08/2015   Procedure: Left Heart Cath and Coronary Angiography;  Surgeon: Peter M SwazilandJordan, MD;  [facility];  Service: Cardiovascular;  Laterality: N/A;  . KNEE SURGERY     Family History  Problem Relation Age of Onset  . Dementia Father   . Breast cancer Mother   . Bone cancer Mother   . Heart disease Mother   . Heart attack Mother    Social History  Substance Use Topics  . Smoking status: Former Smoker    Years: 40.00    Quit date: 07/12/2007  . Smokeless tobacco: Current User    Types: Chew  . Alcohol  use 0.0 oz/week     Comment: occ    Review of Systems  Eyes: Positive for visual disturbance.  Cardiovascular: Positive for leg swelling.  Gastrointestinal: Positive for abdominal pain, anal bleeding and constipation.  Psychiatric/Behavioral:       Sadness, anxiety, stress.  All other systems reviewed and are negative.   Objective:   Today's Vitals: BP 108/68   Pulse 96   Temp 98.7 F (37.1 C)   Ht 6\' 1"  (1.854 m)   Wt (!) 351 lb 4 oz (159.3 kg)   BMI 46.34 kg/m   Physical Exam  Constitutional: He is oriented to person, place, and time.  Morbidly obese male in NAD.  HENT:  Mouth/Throat: Oropharynx is clear and moist.  Eyes: Conjunctivae are normal. No scleral icterus.  Neck: Normal range of motion. Neck supple.  Cardiovascular: Normal rate and regular rhythm.   Pulmonary/Chest: Effort normal. He has no wheezes. He has no rales.  Abdominal:  Soft, nondistended. Umbilical hernia. Marked RLQ tenderness to palpation.   Genitourinary:  Genitourinary Comments: Large rectal mass noted. Central ulceration.   Neurological: He is alert and oriented to person, place, and time.  Skin: Skin is warm. No rash noted.  Psychiatric: He has a normal mood and affect.  Vitals reviewed.  Assessment & Plan:   Problem List Items Addressed This Visit      Other  RLQ abdominal pain - Primary    New problem (to me). Unclear etiology at this time. CT unrevealing. Needs colonoscopy, especially in setting of rectal bleeding. Will discuss and refer to GI.       Relevant Orders   Ambulatory referral to Gastroenterology   Long term (current) use of anticoagulants [Z79.01]    Stopped Warfarin due to bleeding. Will discuss with cardiology.        Other Visit Diagnoses    Rectal bleeding       Relevant Orders   CBC (Completed)   Ambulatory referral to Gastroenterology      Meds ordered this encounter  Medications  . diphenhydramine-acetaminophen (TYLENOL PM) 25-500 MG TABS tablet      Sig: Take 2 tablets by mouth at bedtime as needed.   Follow-up: PRN  Everlene Other DO [facility]

## 2016-11-26 NOTE — Assessment & Plan Note (Signed)
New problem (to me). Unclear etiology at this time. CT unrevealing. Needs colonoscopy, especially in setting of rectal bleeding. Will discuss and refer to GI.

## 2016-11-27 ENCOUNTER — Telehealth: Payer: Self-pay | Admitting: Cardiology

## 2016-11-27 NOTE — Telephone Encounter (Signed)
Duplicate encounter. First encounter was routed to MD & pharmacy staff.

## 2016-11-27 NOTE — Telephone Encounter (Signed)
Pt wanted you to know he is taking his Warfarin. Pt has an ulcer in his rectum and is bleeding so much. He went to his doctor for this yesterday.

## 2016-11-27 NOTE — Telephone Encounter (Signed)
Routed to MD & pharmacy staff

## 2016-11-27 NOTE — Telephone Encounter (Signed)
Pt wanted you to know he is not taking his Warfarin.He has an ulcer in his rectum and bleeding so much.He went to his doctor for this yesterday.

## 2016-11-27 NOTE — Telephone Encounter (Signed)
Patient very upset and frustrated with not getting a call back from doctors office in a timely manner.   He understands the risks of stopping his warfarin (inclusing potential DVt/PE or stroke) but expressed his  bleeding was too much and was "bleeding out".  He refused taking anticoagulation.

## 2016-11-30 ENCOUNTER — Telehealth: Payer: Self-pay | Admitting: Family Medicine

## 2016-11-30 NOTE — Telephone Encounter (Signed)
See if we can expedite his appt please.

## 2016-11-30 NOTE — Telephone Encounter (Signed)
See message below. Spoke in regards to Right lower quadrant pain . Able to work today.  After the first bowel movement he notices bright red blood in toilet, then after  second bowel movement no blood in toilet, just when he wipes .  He is alum to help with .  hemorrhoids.

## 2016-11-30 NOTE — Telephone Encounter (Signed)
Patient wants Dr. Adriana Simasook to know  that his side pain has been increasingly worse over the weekend and has to take Tylenol pm to go to sleep d/t the pain. I have included this information in his referral to Dr. Angus PalmsWohl/Anna.  Pt cb [phone]

## 2016-12-02 NOTE — Telephone Encounter (Signed)
After I spoke to the patient on Monday I informed him that his referral was sent to  GI for Dr. Wohl/Dr. Tobi BastosAnna and that they were going to call him to schedule.  They called him the same day and per the receptionist that scheduled his appointment (I called because it was flagged as patient refused but was also scheduled with Dr. Tobi BastosAnna on 6/26) he stated that if he [facility] it would cost $4000 and if he goes to [facility] it would only be $1000 and [facility] is working itself out of business. He said that he could get his colonoscopy their and Dr. Tobi BastosAnna would have the results by 6/26.  She put a message in his referral that he wants to go to [facility]. I faxed it over to them and called this morning to see if he was scheduled. They said that he is scheduled for an office visit on 6/27 and will schedule the colonoscopy after. That date was the best that they could do.

## 2016-12-15 ENCOUNTER — Encounter: Payer: Self-pay | Admitting: Gastroenterology

## 2016-12-15 ENCOUNTER — Ambulatory Visit (INDEPENDENT_AMBULATORY_CARE_PROVIDER_SITE_OTHER): Payer: Self-pay | Admitting: Gastroenterology

## 2016-12-15 VITALS — BP 112/69 | HR 73 | Temp 98.4°F | Ht 73.0 in | Wt 345.2 lb

## 2016-12-15 DIAGNOSIS — R1031 Right lower quadrant pain: Secondary | ICD-10-CM

## 2016-12-15 DIAGNOSIS — K581 Irritable bowel syndrome with constipation: Secondary | ICD-10-CM

## 2016-12-15 NOTE — Progress Notes (Signed)
Wyline MoodKiran Tymir Terral MD, MRCP(U.K) [address]  [address]  [place], [place] [address]  Main: [phone]  Fax: [phone]   Gastroenterology Consultation  Referring Provider:     Tommie Samsook, Jayce G, DO Primary Care Physician:  Tommie Samsook, Jayce G, DO Primary Gastroenterologist:  Dr. Wyline MoodKiran Saylee Sherrill  Reason for Consultation:     Rectal bleeding         HPI:   Cory Nolan is a 61 y.o. y/o male referred for consultation & management  by Dr. Adriana Simasook, Verdis FredericksonJayce G, DO.    CT abdomen 08/2016 showed fatty liver  , small rt iliac aneurysm otherwise nil acute.  Hb was normal 11/2016  He says [facility] is due to have a colonoscopy soon . He does not have insurance and he pays out of pocket .   Rectal bleeding : Says his bowel movements have changes since last few years  Onset and where was blood seen  :3 weeks back was constipated, usually prune juice works , subsequently forced him self to go and "blew it up ".  Frequency of bowel movements :he has a bowel movement once in two days , has the urge but cant  Consistency : unripe banana Change in shape of stool:rgular shape  Pain associated with bowel movements:none  No weight lose  Prior colonoscopy - none  Family history of colon cancer or polyps:Father had colon polyps  Weight loss:5 lbs.   Lots of vegetables in his diet .     Has tried magensium citrate , prune juice, bananas, oat meal - used to work but does not.   Abdominal pain: Onset: 2 years back , all the time  Site :RLQ Radiation: last few weeks towards umbilicus and testicles.  Severity :gradually increased to 3/10  Nature of pain: sharp  Aggravating factors:   Laying down  Relieving factors :bowel movements , distraction  Weight loss: no  NSAID use: none    Past Medical History:  Diagnosis Date  . Bilateral pulmonary embolism, multiple 03/13/15 03/08/15  . Coronary artery disease 03/08/15  . Hepatic steatosis 03/19/2015  . Hyperlipidemia with target  LDL less than 70 03/18/2015  . Iliac artery aneurysm, right (HCC) 03/19/2015  . Morbid obesity (HCC)   . Prediabetes 03/18/2015  . S/P angioplasty with stent-RCA prox and distal-DES 03/06/15 03/08/15    Past Surgical History:  Procedure Laterality Date  . CARDIAC CATHETERIZATION N/A 03/08/2015   Procedure: Left Heart Cath and Coronary Angiography;  Surgeon: Peter M SwazilandJordan, MD;  [facility];  Service: Cardiovascular;  Laterality: N/A;  . KNEE SURGERY      Prior to Admission medications   Medication Sig Start Date End Date Taking? Authorizing Provider  acetaminophen (TYLENOL) 650 MG CR tablet Take 1,300 mg by mouth every 8 (eight) hours as needed for pain.    Yes [provider]  diphenhydramine-acetaminophen (TYLENOL PM) 25-500 MG TABS tablet Take 2 tablets by mouth at bedtime as needed.   Yes [provider]  ezetimibe (ZETIA) 10 MG tablet Take 1 tablet (10 mg total) by mouth daily. 09/09/16  Yes SwazilandJordan, Peter M, MD  Glucosamine HCl (GLUCOSAMINE PO) Take 1 capsule by mouth daily.   Yes [provider]  hydrocortisone cream 1 % Apply 1 application topically as needed for itching.   Yes [provider]  metoprolol succinate (TOPROL-XL) 25 MG 24 hr tablet Take 1 tablet (25 mg total) by mouth daily. 09/29/16  Yes Swazil[place], Peter M, MD  nitroGLYCERIN (NITROSTAT) 0.4 MG SL tablet Place 1 tablet (0.4 mg total) under the tongue every 5 (five) minutes x 3 doses as needed for chest pain. 03/22/15  Yes Meng, Moffat, PA  Omega-3 Fatty Acids (FISH OIL PO) Take by mouth.   Yes [provider]  warfarin (COUMADIN) 5 MG tablet Take 1-2 tablets (5-10 mg total) by mouth daily. Patient not taking: Reported on 12/15/2016 07/30/16   Swaziland, Peter M, MD    Family History  Problem Relation Age of Onset  . Dementia Father   . Breast cancer Mother   . Bone cancer Mother   . Heart disease Mother   . Heart attack Mother      Social History  Substance Use Topics   . Smoking status: Former Smoker    Years: 40.00    Quit date: 07/12/2007  . Smokeless tobacco: Current User    Types: Chew  . Alcohol use 0.0 oz/week     Comment: occ    Allergies as of 12/15/2016  . (No Known Allergies)    Review of Systems:    All systems reviewed and negative except where noted in HPI.   Physical Exam:  BP 112/69 (BP Location: Left Arm, Patient Position: Sitting, Cuff Size: Large)   Pulse 73   Temp 98.4 F (36.9 C) (Oral)   Ht 6\' 1"  (1.854 m)   Wt (!) 345 lb 3.2 oz (156.6 kg)   BMI 45.54 kg/m  No LMP for male patient. Psych:  Alert and cooperative. Normal mood and affect. General:   Alert,  Well-developed, well-nourished, pleasant and cooperative in NAD Head:  Normocephalic and atraumatic. Eyes:  Sclera clear, no icterus.   Conjunctiva pink. Ears:  Normal auditory acuity. Nose:  No deformity, discharge, or lesions. Mouth:  No deformity or lesions,oropharynx pink & moist. Neck:  Supple; no masses or thyromegaly. Lungs:  Respirations even and unlabored.  Clear throughout to auscultation.   No wheezes, crackles, or rhonchi. No acute distress. Heart:  Regular rate and rhythm; no murmurs, clicks, rubs, or gallops. Abdomen:  Normal bowel sounds.  No bruits.  Soft, non-tender and non-distended without masses, hepatosplenomegaly or hernias noted.  No guarding or rebound tenderness.   External anal exam , large external hemorroid.  Neurologic:  Alert and oriented x3;  grossly normal neurologically. Skin:  Intact without significant lesions or rashes. No jaundice. Psych:  Alert and cooperative. Normal mood and affect.  Imaging Studies: No results found.  Assessment and Plan:   Cory Nolan is a 61 y.o. y/o male has been referred for rectal bleeding likely due to hemorroids and constipation. 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 Due for colonoscopy at [facility] endoscopy in a few days will wait for report 2. Trial of linzess 290 mcg -  samples provided    Follow up in 6 weeks   Dr Wyline Mood MD,MRCP(U.K)

## 2016-12-16 ENCOUNTER — Other Ambulatory Visit: Payer: Self-pay | Admitting: Cardiology

## 2016-12-16 MED FILL — METOPROLOL SUCC ER 25 MG TA: 25 | 30 days supply | Qty: 30 | Fill #2

## 2016-12-18 ENCOUNTER — Telehealth: Payer: Self-pay | Admitting: Cardiology

## 2016-12-18 NOTE — Telephone Encounter (Signed)
New message     Pt stopped the warafrin due to rectal bleeding he wants this medication changed. Pt upset has not been called back or followed up with

## 2016-12-18 NOTE — Telephone Encounter (Signed)
Spoke with patient and he ha not had any bleeding since he stopped Warfarin around 11/21/16. He does NOTwant to go back on Warfarin until after his colonoscopy 12/26/16. He is willing to resume Warfarin at a lower INR goal. He did notice that he was having problems with lower extremity swelling that subsided after d/c Warfarin. He stated he could not afford anything more expensive than Warfarin. Will forward to Dr SwazilandJordan for review.

## 2016-12-20 NOTE — Telephone Encounter (Signed)
OK to hold coumadin until after his colonoscopy - then we will know risk of bleeding going forward. I would recommend resumption of coumadin if GI evaluation OK.  Peter SwazilandJordan MD, [facility]

## 2016-12-25 NOTE — Telephone Encounter (Signed)
Called patient no answer.LMTC. 

## 2016-12-28 NOTE — Telephone Encounter (Signed)
Follow up    Pt is calling stating that he has received 4 calls and cant get to the phone in time. He said he is unsure why someone is calling. Please call.

## 2016-12-28 NOTE — Telephone Encounter (Signed)
Returned call to patient Dr.Jordan's recommendations given.Patient stated for the past week he has been sob.He gets sob with the least exertion.No chest pain.Appointment scheduled with Corine ShelterLuke Kilroy PA 12/29/16

## 2016-12-29 ENCOUNTER — Emergency Department (HOSPITAL_COMMUNITY): Payer: Self-pay

## 2016-12-29 ENCOUNTER — Encounter (HOSPITAL_COMMUNITY): Payer: Self-pay

## 2016-12-29 ENCOUNTER — Inpatient Hospital Stay (HOSPITAL_COMMUNITY)
Admission: EM | Admit: 2016-12-29 | Discharge: 2016-12-31 | DRG: 176 | Disposition: A | Discharge status: Home / Self Care | Payer: Self-pay | Attending: Internal Medicine | Admitting: Internal Medicine

## 2016-12-29 ENCOUNTER — Ambulatory Visit (INDEPENDENT_AMBULATORY_CARE_PROVIDER_SITE_OTHER): Payer: Self-pay | Admitting: Cardiology

## 2016-12-29 ENCOUNTER — Encounter: Payer: Self-pay | Admitting: Cardiology

## 2016-12-29 ENCOUNTER — Telehealth: Payer: Self-pay | Admitting: *Deleted

## 2016-12-29 ENCOUNTER — Other Ambulatory Visit: Payer: Self-pay

## 2016-12-29 VITALS — BP 136/82 | HR 82 | Ht 73.0 in | Wt 345.8 lb

## 2016-12-29 DIAGNOSIS — Z6841 Body Mass Index (BMI) 40.0 and over, adult: Secondary | ICD-10-CM

## 2016-12-29 DIAGNOSIS — Z95820 Peripheral vascular angioplasty status with implants and grafts: Secondary | ICD-10-CM

## 2016-12-29 DIAGNOSIS — I2510 Atherosclerotic heart disease of native coronary artery without angina pectoris: Secondary | ICD-10-CM | POA: Diagnosis present

## 2016-12-29 DIAGNOSIS — Z7982 Long term (current) use of aspirin: Secondary | ICD-10-CM

## 2016-12-29 DIAGNOSIS — R0609 Other forms of dyspnea: Secondary | ICD-10-CM

## 2016-12-29 DIAGNOSIS — K760 Fatty (change of) liver, not elsewhere classified: Secondary | ICD-10-CM | POA: Diagnosis present

## 2016-12-29 DIAGNOSIS — Z808 Family history of malignant neoplasm of other organs or systems: Secondary | ICD-10-CM

## 2016-12-29 DIAGNOSIS — Z9861 Coronary angioplasty status: Secondary | ICD-10-CM

## 2016-12-29 DIAGNOSIS — Z86711 Personal history of pulmonary embolism: Secondary | ICD-10-CM

## 2016-12-29 DIAGNOSIS — E1151 Type 2 diabetes mellitus with diabetic peripheral angiopathy without gangrene: Secondary | ICD-10-CM | POA: Diagnosis present

## 2016-12-29 DIAGNOSIS — Z7901 Long term (current) use of anticoagulants: Secondary | ICD-10-CM

## 2016-12-29 DIAGNOSIS — E785 Hyperlipidemia, unspecified: Secondary | ICD-10-CM | POA: Diagnosis present

## 2016-12-29 DIAGNOSIS — I739 Peripheral vascular disease, unspecified: Secondary | ICD-10-CM | POA: Diagnosis present

## 2016-12-29 DIAGNOSIS — Z789 Other specified health status: Secondary | ICD-10-CM

## 2016-12-29 DIAGNOSIS — K649 Unspecified hemorrhoids: Secondary | ICD-10-CM | POA: Diagnosis present

## 2016-12-29 DIAGNOSIS — Z8249 Family history of ischemic heart disease and other diseases of the circulatory system: Secondary | ICD-10-CM

## 2016-12-29 DIAGNOSIS — I2699 Other pulmonary embolism without acute cor pulmonale: Principal | ICD-10-CM | POA: Diagnosis present

## 2016-12-29 DIAGNOSIS — K5900 Constipation, unspecified: Secondary | ICD-10-CM | POA: Diagnosis present

## 2016-12-29 DIAGNOSIS — R0600 Dyspnea, unspecified: Secondary | ICD-10-CM

## 2016-12-29 DIAGNOSIS — R7303 Prediabetes: Secondary | ICD-10-CM | POA: Diagnosis present

## 2016-12-29 DIAGNOSIS — Z955 Presence of coronary angioplasty implant and graft: Secondary | ICD-10-CM

## 2016-12-29 DIAGNOSIS — I7121 Aneurysm of the ascending aorta, without rupture: Secondary | ICD-10-CM

## 2016-12-29 DIAGNOSIS — Z959 Presence of cardiac and vascular implant and graft, unspecified: Secondary | ICD-10-CM

## 2016-12-29 DIAGNOSIS — H5462 Unqualified visual loss, left eye, normal vision right eye: Secondary | ICD-10-CM | POA: Diagnosis present

## 2016-12-29 DIAGNOSIS — Z87891 Personal history of nicotine dependence: Secondary | ICD-10-CM

## 2016-12-29 DIAGNOSIS — I712 Thoracic aortic aneurysm, without rupture: Secondary | ICD-10-CM | POA: Diagnosis present

## 2016-12-29 HISTORY — DX: Type 2 diabetes mellitus without complications: E11.9

## 2016-12-29 LAB — CBC
HEMATOCRIT: 41.4 % (ref 39.0–52.0)
HEMOGLOBIN: 13.7 g/dL (ref 13.0–17.0)
MCH: 29.5 pg (ref 26.0–34.0)
MCHC: 33.1 g/dL (ref 30.0–36.0)
MCV: 89.2 fL (ref 78.0–100.0)
Platelets: 178 10*3/uL (ref 150–400)
RBC: 4.64 MIL/uL (ref 4.22–5.81)
RDW: 14 % (ref 11.5–15.5)
WBC: 6.2 10*3/uL (ref 4.0–10.5)

## 2016-12-29 LAB — D-DIMER, QUANTITATIVE: D-DIMER: 2.28 mg/L FEU — ABNORMAL HIGH (ref 0.00–0.49)

## 2016-12-29 LAB — BASIC METABOLIC PANEL
Anion gap: 8 (ref 5–15)
BUN: 11 mg/dL (ref 6–20)
CO2: 25 mmol/L (ref 22–32)
CREATININE: 0.84 mg/dL (ref 0.61–1.24)
Calcium: 9.3 mg/dL (ref 8.9–10.3)
Chloride: 101 mmol/L (ref 101–111)
GFR calc Af Amer: >60 mL/min (ref >60)
GLUCOSE: 103 mg/dL — AB (ref 65–99)
POTASSIUM: 4.1 mmol/L (ref 3.5–5.1)
SODIUM: 134 mmol/L — AB (ref 135–145)

## 2016-12-29 LAB — PROTIME-INR
INR: 1
PROTHROMBIN TIME: 13.2 s (ref 11.4–15.2)

## 2016-12-29 LAB — I-STAT TROPONIN, ED: Troponin i, poc: 0 ng/mL (ref 0.00–0.08)

## 2016-12-29 MED ORDER — METOPROLOL SUCCINATE ER 25 MG PO TB24
25.0000 mg | ORAL_TABLET | Freq: Every day | ORAL | Status: DC
  Filled 2016-12-29: qty 1

## 2016-12-29 MED ORDER — ACETAMINOPHEN ER 650 MG PO TBCR: 1300.0000 mg | EXTENDED_RELEASE_TABLET | Freq: Three times a day (TID) | ORAL | Status: DC | PRN

## 2016-12-29 MED ORDER — ACETAMINOPHEN 325 MG PO TABS: 650.0000 mg | ORAL_TABLET | Freq: Four times a day (QID) | ORAL | Status: DC | PRN

## 2016-12-29 MED ORDER — DIPHENHYDRAMINE-APAP (SLEEP) 25-500 MG PO TABS: 2.0000 | ORAL_TABLET | Freq: Every evening | ORAL | Status: DC | PRN

## 2016-12-29 MED ORDER — ONDANSETRON HCL 4 MG/2ML IJ SOLN
4.0000 mg | Freq: Four times a day (QID) | INTRAMUSCULAR | Status: DC | PRN
Start: 2016-12-29 — End: 2016-12-31

## 2016-12-29 MED ORDER — ACETAMINOPHEN 650 MG RE SUPP: 650.0000 mg | Freq: Four times a day (QID) | RECTAL | Status: DC | PRN

## 2016-12-29 MED ORDER — LINACLOTIDE 145 MCG PO CAPS
290.0000 ug | ORAL_CAPSULE | Freq: Every day | ORAL | Status: DC
  Administered 2016-12-30: 290 ug via ORAL
  Filled 2016-12-29 (×2): qty 2
  Filled 2016-12-29: qty 1

## 2016-12-29 MED ORDER — WARFARIN - PHARMACIST DOSING INPATIENT: Freq: Every day | Status: DC

## 2016-12-29 MED ORDER — HEPARIN (PORCINE) IN NACL 100-0.45 UNIT/ML-% IJ SOLN
2100.0000 [IU]/h | INTRAMUSCULAR | Status: DC
  Administered 2016-12-29: 2100 [IU]/h via INTRAVENOUS
  Filled 2016-12-29 (×3): qty 250

## 2016-12-29 MED ORDER — ONDANSETRON HCL 4 MG PO TABS: 4.0000 mg | ORAL_TABLET | Freq: Four times a day (QID) | ORAL | Status: DC | PRN

## 2016-12-29 MED ORDER — HEPARIN BOLUS VIA INFUSION
6000.0000 [IU] | Freq: Once | INTRAVENOUS | Status: AC
  Administered 2016-12-29: 6000 [IU] via INTRAVENOUS
  Filled 2016-12-29: qty 6000

## 2016-12-29 MED ORDER — SODIUM CHLORIDE 0.9% FLUSH
3.0000 mL | Freq: Two times a day (BID) | INTRAVENOUS | Status: DC
  Administered 2016-12-29 – 2016-12-31 (×3): 3 mL via INTRAVENOUS

## 2016-12-29 MED ORDER — ASPIRIN EC 81 MG PO TBEC
81.0000 mg | DELAYED_RELEASE_TABLET | Freq: Every day | ORAL | Status: DC
  Administered 2016-12-30 – 2016-12-31 (×2): 81 mg via ORAL
  Filled 2016-12-29: qty 1

## 2016-12-29 MED ORDER — IOPAMIDOL (ISOVUE-370) INJECTION 76%
INTRAVENOUS | Status: AC
  Administered 2016-12-29: 100 mL
  Filled 2016-12-29: qty 100

## 2016-12-29 NOTE — Assessment & Plan Note (Signed)
Pt seen in the office today with complaints of dyspnea which started 4 days ago

## 2016-12-29 NOTE — ED Provider Notes (Signed)
[facility]Y DEPT Provider Note   CSN: [id number] Arrival date & time: 12/29/16  1711     History   Chief Complaint Chief Complaint  Patient presents with  . Chest Pain    HPI Cory Nolan is a 61 y.o. male.  HPI   Presents with concern for dyspnea. Had ddimer done at PCP and it was positive and he was sent to ED.  Reports dyspnea beginning last Thursday suddenly. Reports 2 weeks ago he had bleeding hemorrhoids and stopped his coumadin on his own, then followed up andstayed off of it with colonoscopy to be scheduled.  SOB today similar to prior PE. No chest pain. Although reports he didn't have this prior to stenting and is concerned about needing a stent today.  Denies lightheadedness or syncope. Cough today.    Past Medical History:  Diagnosis Date  . Bilateral pulmonary embolism, multiple 03/13/15 03/08/15  . Coronary artery disease 03/08/15  . Diabetes mellitus without complication (HCC)   . Hepatic steatosis 03/19/2015  . Hyperlipidemia with target LDL less than 70 03/18/2015  . Iliac artery aneurysm, right (HCC) 03/19/2015  . Morbid obesity (HCC)   . Prediabetes 03/18/2015  . S/P angioplasty with stent-RCA prox and distal-DES 03/06/15 03/08/15    Patient Active Problem List   Diagnosis Date Noted  . PVD (peripheral vascular disease) (HCC) 12/29/2016  . 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 RLQ abdominal pain 11/26/2016  . Depression 08/30/2015  . Long term (current) use of anticoagulants [Z79.01] 03/27/2015  . Morbid obesity (HCC) 03/25/2015  . Hepatic steatosis 03/19/2015  . Statin intolerance -  03/19/2015  . Prediabetes 03/18/2015  . History of pulmonary embolus (PE) 03/15/2015  . Dyspnea 03/08/2015  . CAD S/P PCI with DES to RCA 03/08/15 03/08/2015    Past Surgical History:  Procedure Laterality Date  . CARDIAC CATHETERIZATION N/A 03/08/2015   Procedure: Left Heart Cath and Coronary Angiography;  Surgeon: Peter M Swaziland, MD;  Location[facility] INVASIVE CV  LAB;  Service: Cardiovascular;  Laterality: N/A;  . KNEE SURGERY         Home Medications    Prior to Admission medications   Medication Sig Start Date End Date Taking? Authorizing Provider  aspirin EC 81 MG tablet Take 81 mg by mouth daily.   Yes [provider]  diphenhydramine-acetaminophen (TYLENOL PM) 25-500 MG TABS tablet Take 2 tablets by mouth at bedtime as needed.   Yes [provider]  ezetimibe (ZETIA) 10 MG tablet Take 1 tablet (10 mg total) by mouth daily. 09/09/16  Yes [place], Peter M, MD  Glucosamine HCl (GLUCOSAMINE PO) Take 1 capsule by mouth daily.   Yes [provider]  hydrocortisone cream 1 % Apply 1 application topically as needed for itching.   Yes [provider]  linaclotide (LINZESS) 290 MCG CAPS capsule Take 290 mcg by mouth daily before breakfast.   Yes [provider]  metoprolol succinate (TOPROL-XL) 25 MG 24 hr tablet TAKE 1 TABLET BY MOUTH DAILY. Patient taking differently: TAKE 1 TABLET AT BEDTIME 12/16/16  Yes [place], Peter M, MD  Omega-3 Fatty Acids (FISH OIL PO) Take 1 capsule by mouth daily.    Yes [provider]  warfarin (COUMADIN) 5 MG tablet Take 5-10 mg by mouth daily.   Yes [provider]  acetaminophen (TYLENOL) 650 MG CR tablet Take 1,300 mg by mouth every 8 (eight) hours as needed for pain.     [provider]  nitroGLYCERIN (NITROSTAT) 0.4  MG SL tablet Place 1 tablet (0.4 mg total) under the tongue every 5 (five) minutes x 3 doses as needed for chest pain. 03/22/15   Azalee Course, PA    Family History Family History  Problem Relation Age of Onset  . Dementia Father   . Breast cancer Mother   . Bone cancer Mother   . Heart disease Mother   . Heart attack Mother     Social History Social History  Substance Use Topics  . Smoking status: Former Smoker    Years: 40.00    Quit date: 07/12/2007  . Smokeless tobacco: Current User    Types: Chew  . Alcohol use 0.0  oz/week     Comment: occ     Allergies   Patient has no known allergies.   Review of Systems Review of Systems  Constitutional: Negative for fever.  HENT: Negative for sore throat.   Eyes: Negative for visual disturbance.  Respiratory: Positive for shortness of breath.   Cardiovascular: Negative for chest pain.  Gastrointestinal: Negative for abdominal pain, nausea and vomiting.  Genitourinary: Negative for difficulty urinating.  Musculoskeletal: Negative for back pain and neck stiffness.  Skin: Negative for rash.  Neurological: Negative for syncope and headaches.     Physical Exam Updated Vital Signs BP 127/77 (BP Location: Right Arm)   Pulse 63   Temp 98 F (36.7 C) (Oral)   Resp 16   Ht 6\' 2"  (1.88 m)   Wt (!) 156.6 kg (345 lb 3.2 oz)   SpO2 98%   BMI 44.32 kg/m   Physical Exam  Constitutional: He is oriented to person, place, and time. He appears well-developed and well-nourished. No distress.  HENT:  Head: Normocephalic and atraumatic.  Eyes: Conjunctivae and EOM are normal.  Neck: Normal range of motion.  Cardiovascular: Normal rate, regular rhythm, normal heart sounds and intact distal pulses.  Exam reveals no gallop and no friction rub.   No murmur heard. Pulmonary/Chest: Effort normal and breath sounds normal. No respiratory distress. He has no wheezes. He has no rales.  Abdominal: Soft. He exhibits no distension. There is no tenderness. There is no guarding.  Musculoskeletal: He exhibits no edema.  Neurological: He is alert and oriented to person, place, and time.  Skin: Skin is warm and dry. He is not diaphoretic.  Nursing note and vitals reviewed.    ED Treatments / Results  Labs (all labs ordered are listed, but only abnormal results are displayed) Labs Reviewed  BASIC METABOLIC PANEL - Abnormal; Notable for the following:       Result Value   Sodium 134 (*)    Glucose, Bld 103 (*)    All other components within normal limits  CBC    PROTIME-INR  CBC  HEPARIN LEVEL (UNFRACTIONATED)  PROTIME-INR  HIV ANTIBODY (ROUTINE TESTING)  I-STAT TROPOININ, ED    EKG  EKG Interpretation None       Radiology Ct Angio Chest Pe W And/or Wo Contrast  Result Date: 12/29/2016 CLINICAL DATA:  Shortness of breath history of PE EXAM: CT ANGIOGRAPHY CHEST WITH CONTRAST TECHNIQUE: Multidetector CT imaging of the chest was performed using the standard protocol during bolus administration of intravenous contrast. Multiplanar CT image reconstructions and MIPs were obtained to evaluate the vascular anatomy. CONTRAST:  100 mL Isovue 370 intravenous COMPARISON:  03/13/2015 FINDINGS: Cardiovascular: Satisfactory opacification of the pulmonary arteries to the segmental level. Multiple filling defects within right upper, right middle and right lower lobe pulmonary arterial branches.  Multifocal filling defects within left lower lobe and left upper lobe segmental and subsegmental pulmonary artery branches consistent with acute pulmonary embolus. RV LV ratio is elevated at 1.0 consistent with right heart strain. Mild cardiomegaly. No pericardial effusion. Coronary artery calcification. Enlarged pulmonary artery trunk at 3.4 cm. Aortic atherosclerosis. Mild aneurysmal dilatation of ascending aorta up to 4.4 cm. Mediastinum/Nodes: No enlarged mediastinal, hilar, or axillary lymph nodes. Thyroid gland, trachea, and esophagus demonstrate no significant findings. Lungs/Pleura: Lungs are clear. No pleural effusion or pneumothorax. Upper Abdomen: Hepatic steatosis.  No acute abnormality. Musculoskeletal: Degenerative changes. No acute or suspicious bone lesion. Review of the MIP images confirms the above findings. IMPRESSION: 1. Multiple bilateral acute pulmonary emboli. Positive for acute PE with CT evidence of right heart strain (RV/LV Ratio = 1.0) consistent with at least submassive (intermediate risk) PE. The presence of right heart strain has been associated with  an increased risk of morbidity and mortality. Please activate Code PE by paging [phone]. 2. Aneurysmal dilatation of the ascending aorta up to 4.4 cm Recommend annual imaging followup by CTA or MRA. This recommendation follows 2010 ACCF/AHA/AATS/ACR/ASA/SCA/SCAI/SIR/STS/SVM Guidelines for the Diagnosis and Management of Patients with Thoracic Aortic Disease. Circulation. 2010; 121: [id number] Aortic Atherosclerosis (ICD10-I70.0). Critical Value/emergent results were called by telephone at the time of interpretation on 12/29/2016 at 7:53 pm to Dr. Alvira MondayERIN Annalia Metzger , who verbally acknowledged these results. Electronically Signed   By: Jasmine PangKim  Fujinaga M.D.   On: 12/29/2016 19:54    Procedures Procedures (including critical care time)  Medications Ordered in ED Medications  heparin ADULT infusion 100 units/mL (25000 units/25850mL sodium chloride 0.45%) (2,100 Units/hr Intravenous Transfusing/Transfer 12/29/16 2340)  Warfarin - Pharmacist Dosing Inpatient (0 each Does not apply Hold 12/29/16 2120)  sodium chloride flush (NS) 0.9 % injection 3 mL (3 mLs Intravenous Given 12/29/16 2318)  acetaminophen (TYLENOL) tablet 650 mg (not administered)    Or  acetaminophen (TYLENOL) suppository 650 mg (not administered)  ondansetron (ZOFRAN) tablet 4 mg (not administered)    Or  ondansetron (ZOFRAN) injection 4 mg (not administered)  aspirin EC tablet 81 mg (not administered)  metoprolol succinate (TOPROL-XL) 24 hr tablet 25 mg (25 mg Oral Not Given 12/30/16 0015)  linaclotide (LINZESS) capsule 290 mcg (not administered)  iopamidol (ISOVUE-370) 76 % injection (100 mLs  Contrast Given 12/29/16 1844)  heparin bolus via infusion 6,000 Units (6,000 Units Intravenous Bolus from Bag 12/29/16 2028)   CRITICAL CARE: pulmonary embolism receiving heparin Performed by: Rhae LernerSchlossman, Leyton Magoon Elizabeth   Total critical care time: 30 minutes  Critical care time was exclusive of separately billable procedures and treating other  patients.  Critical care was necessary to treat or prevent imminent or life-threatening deterioration.  Critical care was time spent personally by me on the following activities: development of treatment plan with patient and/or surrogate as well as nursing, discussions with consultants, evaluation of patient's response to treatment, examination of patient, obtaining history from patient or surrogate, ordering and performing treatments and interventions, ordering and review of laboratory studies, ordering and review of radiographic studies, pulse oximetry and re-evaluation of patient's condition.   Initial Impression / Assessment and Plan / ED Course  I have reviewed the triage vital signs and the nursing notes.  Pertinent labs & imaging results that were available during my care of the patient were reviewed by me and considered in my medical decision making (see chart for details).     61yo male with history of PE, CAD, PVD, morbid obesity, hlpd,  presents with concern for dyspnea.  DDImer done as outpatient and positive.  CTA ordered and positive. Right heart strain on CT. Called pulmonology and physician on cal reviewed imaging, feel RV findings on CT likely secondary to prior PE and effects of smaller acute PE less likely, and do not recommend thrombolysis at this time. VS stable Troponin negative.  Pt placed on heparin gtt. Consulted hospitalist who will admit.   Final Clinical Impressions(s) / ED Diagnoses   Final diagnoses:  Other acute pulmonary embolism without acute cor pulmonale [facility])    New Prescriptions Current Discharge Medication List       Alvira Monday, MD 12/30/16 [phone]

## 2016-12-29 NOTE — ED Triage Notes (Signed)
Pt presents to the ed with complaints of chest pain and shortness of breath. Pt was seen at his doctor and had an elevated d-dimer with history of bilateral blood clots. edp notified and vo for cta of chest. Pt in no acute distress in triage.

## 2016-12-29 NOTE — Assessment & Plan Note (Signed)
Proximal and distal RCA DES stents 03/08/2015 Presenting symptoms were dyspnea then

## 2016-12-29 NOTE — Assessment & Plan Note (Signed)
Truncal obesity with a BMI of 45

## 2016-12-29 NOTE — Assessment & Plan Note (Signed)
significant myalgias with high-dose Lipitor; severe steatohepatitis

## 2016-12-29 NOTE — Assessment & Plan Note (Signed)
Bilateral pulmonary embolism, multiple 03/13/15 (one week post PCI) He has been off Coumadin x 2 weeks secondary to bleeding hemorrhoids and in preparation for colonoscopy

## 2016-12-29 NOTE — ED Notes (Signed)
Nurse starting IV and will draw labs. 

## 2016-12-29 NOTE — Telephone Encounter (Signed)
Called the patient to inform him that his D-Dimer results that he had drawn after his appointment today, 12/29/16, was elevated at 2.28. Patient has been advised to head to the ER for further assessment. He verbalized his understanding and stated that he would head to the ER. The ER has been notified of his pending arrival.

## 2016-12-29 NOTE — Patient Instructions (Addendum)
Medication Instructions: No changes  Labwork: Your physician recommends that you have the following today: Troponin and D-Dimer   Follow-Up: Your physician recommends that you schedule a follow-up appointment in: 3 months or sooner (with Dr. SwazilandJordan) depending on the lab results.    If you need a refill on your cardiac medications before your next appointment, please call your pharmacy.

## 2016-12-29 NOTE — Addendum Note (Signed)
Addended by: Sandi MariscalICHARDSON, LISA Y on: 12/29/2016 10:37 AM   Modules accepted: Orders

## 2016-12-29 NOTE — H&P (Signed)
History and Physical    Cory Nolan WUJ:[id number] DOB: September 10, 1955 DOA: 12/29/2016  PCP: Tommie Samsook, Jayce G, DO  Patient coming from: Home  I have personally briefly reviewed patient[facility]  Chief Complaint: SOB  HPI: Cory Nolan is a 61 y.o. male with medical history significant of PE, and plan had been for life long therapy, but this was stopped 2 weeks ago due to major bleeding from a hemorrhoid (since resolved).  Last Thurs he developed SOB.  No CP.  Had been scheduled to have colonoscopy in the coming week as well.  SOB constant, nothing makes better or worse.   ED Course: CT shows multiple acute bilateral PEs.   Review of Systems: As per HPI otherwise 10 point review of systems negative.   Past Medical History:  Diagnosis Date  . Bilateral pulmonary embolism, multiple 03/13/15 03/08/15  . Coronary artery disease 03/08/15  . Diabetes mellitus without complication (HCC)   . Hepatic steatosis 03/19/2015  . Hyperlipidemia with target LDL less than 70 03/18/2015  . Iliac artery aneurysm, right (HCC) 03/19/2015  . Morbid obesity (HCC)   . Prediabetes 03/18/2015  . S/P angioplasty with stent-RCA prox and distal-DES 03/06/15 03/08/15    Past Surgical History:  Procedure Laterality Date  . CARDIAC CATHETERIZATION N/A 03/08/2015   Procedure: Left Heart Cath and Coronary Angiography;  Surgeon: Peter M SwazilandJordan, MD;  [facility];  Service: Cardiovascular;  Laterality: N/A;  . KNEE SURGERY       reports that he quit smoking about 9 years ago. He quit after 40.00 years of use. His smokeless tobacco use includes Chew. He reports that he drinks alcohol. He reports that he does not use drugs.  No Known Allergies  Family History  Problem Relation Age of Onset  . Dementia Father   . Breast cancer Mother   . Bone cancer Mother   . Heart disease Mother   . Heart attack Mother      Prior to Admission medications   Medication Sig  Start Date End Date Taking? Authorizing Provider  aspirin EC 81 MG tablet Take 81 mg by mouth daily.   Yes [provider]  diphenhydramine-acetaminophen (TYLENOL PM) 25-500 MG TABS tablet Take 2 tablets by mouth at bedtime as needed.   Yes [provider]  ezetimibe (ZETIA) 10 MG tablet Take 1 tablet (10 mg total) by mouth daily. 09/09/16  Yes SwazilandJordan, Peter M, MD  Glucosamine HCl (GLUCOSAMINE PO) Take 1 capsule by mouth daily.   Yes [provider]  hydrocortisone cream 1 % Apply 1 application topically as needed for itching.   Yes [provider]  linaclotide (LINZESS) 290 MCG CAPS capsule Take 290 mcg by mouth daily before breakfast.   Yes [provider]  metoprolol succinate (TOPROL-XL) 25 MG 24 hr tablet TAKE 1 TABLET BY MOUTH DAILY. Patient taking differently: TAKE 1 TABLET AT BEDTIME 12/16/16  Yes SwazilandJordan, Peter M, MD  Omega-3 Fatty Acids (FISH OIL PO) Take 1 capsule by mouth daily.    Yes [provider]  warfarin (COUMADIN) 5 MG tablet Take 5-10 mg by mouth daily.   Yes [provider]  acetaminophen (TYLENOL) 650 MG CR tablet Take 1,300 mg by mouth every 8 (eight) hours as needed for pain.     [provider]  nitroGLYCERIN (NITROSTAT) 0.4 MG SL tablet Place 1 tablet (0.4 mg total) under the tongue every 5 (five) minutes x 3  doses as needed for chest pain. 03/22/15   Azalee Course, PA    Physical Exam: Vitals:   12/29/16 1716 12/29/16 2030 12/29/16 2130  BP: 134/85 121/77 114/73  Pulse: 80 71 67  Resp: 16 15 19   Temp: 98.2 F (36.8 C)    SpO2: 96% 99% 96%    Constitutional: NAD, calm, comfortable Eyes: PERRL, lids and conjunctivae normal ENMT: Mucous membranes are moist. Posterior pharynx clear of any exudate or lesions.Normal dentition.  Neck: normal, supple, no masses, no thyromegaly Respiratory: clear to auscultation bilaterally, no wheezing, no crackles. Normal respiratory effort. No accessory muscle use.    Cardiovascular: Regular rate and rhythm, no murmurs / rubs / gallops. No extremity edema. 2+ pedal pulses. No carotid bruits.  Abdomen: no tenderness, no masses palpated. No hepatosplenomegaly. Bowel sounds positive.  Musculoskeletal: no clubbing / cyanosis. No joint deformity upper and lower extremities. Good ROM, no contractures. Normal muscle tone.  Skin: no rashes, lesions, ulcers. No induration Neurologic: CN 2-12 grossly intact. Sensation intact, DTR normal. Strength 5/5 in all 4.  Psychiatric: Normal judgment and insight. Alert and oriented x 3. Normal mood.    Labs on Admission: I have personally reviewed following labs and imaging studies  CBC:  Recent Labs Lab 12/29/16 1728  WBC 6.2  HGB 13.7  HCT 41.4  MCV 89.2  PLT 178   Basic Metabolic Panel:  Recent Labs Lab 12/29/16 1728  NA 134*  K 4.1  CL 101  CO2 25  GLUCOSE 103*  BUN 11  CREATININE 0.84  CALCIUM 9.3   GFR: Estimated Creatinine Clearance: 142.8 mL/min (by C-G formula based on SCr of 0.84 mg/dL). Liver Function Tests: No results for input(s): AST, ALT, ALKPHOS, BILITOT, PROT, ALBUMIN in the last 168 hours. No results for input(s): LIPASE, AMYLASE in the last 168 hours. No results for input(s): AMMONIA in the last 168 hours. Coagulation Profile:  Recent Labs Lab 12/29/16 1728  INR 1.00   Cardiac Enzymes: No results for input(s): CKTOTAL, CKMB, CKMBINDEX, TROPONINI in the last 168 hours. BNP (last 3 results) No results for input(s): PROBNP in the last 8760 hours. HbA1C: No results for input(s): HGBA1C in the last 72 hours. CBG: No results for input(s): GLUCAP in the last 168 hours. Lipid Profile: No results for input(s): CHOL, HDL, LDLCALC, TRIG, CHOLHDL, LDLDIRECT in the last 72 hours. Thyroid Function Tests: No results for input(s): TSH, T4TOTAL, FREET4, T3FREE, THYROIDAB in the last 72 hours. Anemia Panel: No results for input(s): VITAMINB12, FOLATE, FERRITIN, TIBC, IRON, RETICCTPCT in  the last 72 hours. Urine analysis:    Component Value Date/Time   COLORURINE YELLOW 09/02/2016 1840   APPEARANCEUR CLEAR 09/02/2016 1840   LABSPEC 1.025 09/02/2016 1840   PHURINE 6.0 09/02/2016 1840   GLUCOSEU NEGATIVE 09/02/2016 1840   HGBUR NEGATIVE 09/02/2016 1840   BILIRUBINUR NEGATIVE 09/02/2016 1840   KETONESUR NEGATIVE 09/02/2016 1840   PROTEINUR NEGATIVE 09/02/2016 1840   NITRITE NEGATIVE 09/02/2016 1840   LEUKOCYTESUR NEGATIVE 09/02/2016 1840    Radiological Exams on Admission: Ct Angio Chest Pe W And/or Wo Contrast  Result Date: 12/29/2016 CLINICAL DATA:  Shortness of breath history of PE EXAM: CT ANGIOGRAPHY CHEST WITH CONTRAST TECHNIQUE: Multidetector CT imaging of the chest was performed using the standard protocol during bolus administration of intravenous contrast. Multiplanar CT image reconstructions and MIPs were obtained to evaluate the vascular anatomy. CONTRAST:  100 mL Isovue 370 intravenous COMPARISON:  03/13/2015 FINDINGS: Cardiovascular: Satisfactory opacification of the pulmonary arteries to the  segmental level. Multiple filling defects within right upper, right middle and right lower lobe pulmonary arterial branches. Multifocal filling defects within left lower lobe and left upper lobe segmental and subsegmental pulmonary artery branches consistent with acute pulmonary embolus. RV LV ratio is elevated at 1.0 consistent with right heart strain. Mild cardiomegaly. No pericardial effusion. Coronary artery calcification. Enlarged pulmonary artery trunk at 3.4 cm. Aortic atherosclerosis. Mild aneurysmal dilatation of ascending aorta up to 4.4 cm. Mediastinum/Nodes: No enlarged mediastinal, hilar, or axillary lymph nodes. Thyroid gland, trachea, and esophagus demonstrate no significant findings. Lungs/Pleura: Lungs are clear. No pleural effusion or pneumothorax. Upper Abdomen: Hepatic steatosis.  No acute abnormality. Musculoskeletal: Degenerative changes. No acute or  suspicious bone lesion. Review of the MIP images confirms the above findings. IMPRESSION: 1. Multiple bilateral acute pulmonary emboli. Positive for acute PE with CT evidence of right heart strain (RV/LV Ratio = 1.0) consistent with at least submassive (intermediate risk) PE. The presence of right heart strain has been associated with an increased risk of morbidity and mortality. Please activate Code PE by paging [phone]. 2. Aneurysmal dilatation of the ascending aorta up to 4.4 cm Recommend annual imaging followup by CTA or MRA. This recommendation follows 2010 ACCF/AHA/AATS/ACR/ASA/SCA/SCAI/SIR/STS/SVM Guidelines for the Diagnosis and Management of Patients with Thoracic Aortic Disease. Circulation. 2010; 121: U981-X914 Aortic Atherosclerosis (ICD10-I70.0). Critical Value/emergent results were called by telephone at the time of interpretation on 12/29/2016 at 7:53 pm to Dr. Alvira Monday , who verbally acknowledged these results. Electronically Signed   By: Jasmine Pang M.D.   On: 12/29/2016 19:54    EKG: Independently reviewed.  Assessment/Plan Active Problems:   Recurrent pulmonary emboli (HCC)    1. Recurrent PE - 1. Heparin gtt 2. Tele montior 3. Bridge to coumadin 4. Possibly candidate for lovenox bridge to coumadin and discharge tomorrow. 5. At patient request, to try and save him some money will not order 2d echo or venous doppler of BLE 1. Not that I think findings of R heart strain or presence / absence of DVT in leg would make major changes to management at this point: as I told patient, life long anticoagulation is essentially indicated until / unless he develops a bleeding complication in the future.  DVT prophylaxis: Heparin bridge to coumadin Code Status: Full Family Communication: No family in room Disposition Plan: Home after admit Consults called: None Admission status: Place in obs   Kessa Fairbairn, Heywood Iles. DO [facility] Pager [phone]  If 7AM-7PM,  please contact day team taking care of patient [email or web address] Password [facility]  12/29/2016, 10:57 PM

## 2016-12-29 NOTE — Progress Notes (Addendum)
Addnedum: Patient says took 10mg  Coumadin today. Bolus already been given after holding Coumadin x2 weeks. Stat INR = 1. Request received per MD to resume Coumadin as well. No further Coumadin tonight as already took today. Will follow-up INR in AM.   ANTICOAGULATION CONSULT NOTE - Initial Consult  Pharmacy Consult for Heparin Indication: pulmonary embolus  No Known Allergies  Patient Measurements: Weight: 156.9 kg Height: 6'1" IBW: 74 kg Heparin Dosing Weight: 117 kg  Vital Signs: Temp: 98.2 F (36.8 C) (07/10 1716) BP: 134/85 (07/10 1716) Pulse Rate: 80 (07/10 1716)  Labs:  Recent Labs  12/29/16 1728  HGB 13.7  HCT 41.4  PLT 178  CREATININE 0.84    Estimated Creatinine Clearance: 142.8 mL/min (by C-G formula based on SCr of 0.84 mg/dL).   Medical History: Past Medical History:  Diagnosis Date  . Bilateral pulmonary embolism, multiple 03/13/15 03/08/15  . Coronary artery disease 03/08/15  . Diabetes mellitus without complication (HCC)   . Hepatic steatosis 03/19/2015  . Hyperlipidemia with target LDL less than 70 03/18/2015  . Iliac artery aneurysm, right (HCC) 03/19/2015  . Morbid obesity (HCC)   . Prediabetes 03/18/2015  . S/P angioplasty with stent-RCA prox and distal-DES 03/06/15 03/08/15    Assessment: 61 year old male with history of blood clots who presented to ED with chest pain and shortness of breath after being off Coumadin for 2 weeks secondary to bleeding hemorrhoids in preparation for colonscopy. CT angio is positive for multiple bilateral acute pulmonary emboli with RV/LV ratio of 1. CBC is within normal limits. No bleeding. SCr 0.84.   Goal of Therapy:  Heparin level 0.3-0.7 units/ml Monitor platelets by anticoagulation protocol: Yes   Plan:  Give 6000 units bolus x 1 Start heparin infusion at 2100 units/hr  Check 6 hour level Daily Heparin level and CBC while on therapy  Link SnufferJessica Hosteen Kienast, PharmD, BCPS Clinical Pharmacist Clinical Phone 12/29/2016  until 11 Pm - #[phone] After hours, please call #[phone] 12/29/2016,8:05 PM

## 2016-12-29 NOTE — Progress Notes (Signed)
12/29/2016 Cory Nolan   07/21/1955  [id number]  Primary Physician Cory Sams, DO Primary Cardiologist: Dr Cory Nolan  HPI:  Pt is in the office today with complaints of dyspnea since last Thursday. He says this is a definite change from his baseline. He denies chest pain though he admits he didn't have chest pain in when he presented prior to his PCI. He has been off Coumadin for two weeks secondary to bleeding hemorrhoids and is to have a colonoscopy as well. Dr Cory Nolan and the pt discussed duration of Coumadin Rx after his PE in Sept 2016. It was decided to continue it indefinitely, though stopping it after 6 months of Rx was an option.   The pt is in the office today with complaints of dyspnea for the past four days "like before". He denies hemoptysis.   Current Outpatient Prescriptions  Medication Sig Dispense Refill  . acetaminophen (TYLENOL) 650 MG CR tablet Take 1,300 mg by mouth every 8 (eight) hours as needed for pain.     Marland Kitchen aspirin EC 81 MG tablet Take 81 mg by mouth daily.    . diphenhydramine-acetaminophen (TYLENOL PM) 25-500 MG TABS tablet Take 2 tablets by mouth at bedtime as needed.    . ezetimibe (ZETIA) 10 MG tablet Take 1 tablet (10 mg total) by mouth daily. 30 tablet 6  . Glucosamine HCl (GLUCOSAMINE PO) Take 1 capsule by mouth daily.    . hydrocortisone cream 1 % Apply 1 application topically as needed for itching.    . linaclotide (LINZESS) 290 MCG CAPS capsule Take 290 mcg by mouth daily before breakfast.    . metoprolol succinate (TOPROL-XL) 25 MG 24 hr tablet TAKE 1 TABLET BY MOUTH DAILY. 30 tablet 2  . nitroGLYCERIN (NITROSTAT) 0.4 MG SL tablet Place 1 tablet (0.4 mg total) under the tongue every 5 (five) minutes x 3 doses as needed for chest pain. 25 tablet 3  . Omega-3 Fatty Acids (FISH OIL PO) Take 1 capsule by mouth daily.     Marland Kitchen warfarin (COUMADIN) 5 MG tablet Take 5-10 mg by mouth daily.     No current facility-administered medications for this visit.      No Known Allergies  Past Medical History:  Diagnosis Date  . Bilateral pulmonary embolism, multiple 03/13/15 03/08/15  . Coronary artery disease 03/08/15  . Hepatic steatosis 03/19/2015  . Hyperlipidemia with target LDL less than 70 03/18/2015  . Iliac artery aneurysm, right (HCC) 03/19/2015  . Morbid obesity (HCC)   . Prediabetes 03/18/2015  . S/P angioplasty with stent-RCA prox and distal-DES 03/06/15 03/08/15    Social History   Social History  . Marital status: Unknown    Spouse name: N/A  . Number of children: N/A  . Years of education: N/A   Occupational History  . Not on file.   Social History Main Topics  . Smoking status: Former Smoker    Years: 40.00    Quit date: 07/12/2007  . Smokeless tobacco: Current User    Types: Chew  . Alcohol use 0.0 oz/week     Comment: occ  . Drug use: No  . Sexual activity: No   Other Topics Concern  . Not on file   Social History Narrative  . No narrative on file     Family History  Problem Relation Age of Onset  . Dementia Father   . Breast cancer Mother   . Bone cancer Mother   . Heart disease Mother   .  Heart attack Mother      Review of Systems: General: negative for chills, fever, night sweats or weight changes.  Cardiovascular: negative for chest pain, dyspnea on exertion, edema, orthopnea, palpitations, paroxysmal nocturnal dyspnea or shortness of breath Dermatological: negative for rash Respiratory: negative for cough or wheezing Urologic: negative for hematuria Abdominal: negative for nausea, vomiting, diarrhea, bright red blood per rectum, melena, or hematemesis Neurologic: negative for visual changes, syncope, or dizziness All other systems reviewed and are otherwise negative except as noted above.    Blood pressure 136/82, pulse 82, height 6\' 1"  (1.854 m), weight (!) 345 lb 12.8 oz (156.9 kg).  General appearance: no distress and morbidly obese Neck: no carotid bruit and no JVD Lungs: clear to  auscultation bilaterally Heart: regular rate and rhythm Abd; truncal obesity Extremities: trace edema both LE Skin: Skin color, texture, turgor normal. No rashes or lesions Neurologic: Grossly normal  EKG EKG has irregular baseline and not interpretable but I could tell he was in a regular rhythm and no obvious ST elevation  ASSESSMENT AND PLAN:   Dyspnea Pt seen in the office today with complaints of dyspnea which started 4 days ago  History of pulmonary embolus (PE) Bilateral pulmonary embolism, multiple 03/13/15 (one week post PCI) He has been off Coumadin x 2 weeks secondary to bleeding hemorrhoids and in preparation for colonoscopy  CAD S/P PCI with DES to RCA 03/08/15 Proximal and distal RCA DES stents 03/08/2015 Presenting symptoms were dyspnea then  Morbid obesity (HCC) Truncal obesity with a BMI of 45  Statin intolerance -  significant myalgias with high-dose Lipitor; severe steatohepatitis   PLAN  I felt like the best course was for Cory Nolan to go to the ED for a CTA. to r/o recurrent PE while he has been off Coumadin.  He declined this option secondary to cost. He doesn't work and has no Community education officerinsurance. He felt a little frustrated "I've spent all this money and I'm no better".  Despite my strenuous urging he declined evan an OP CTA. He did agree to a STAT D-dimer and Troponin I.  Cory ShelterLuke Lindi Abram PA-C 12/29/2016 10:14 AM

## 2016-12-30 ENCOUNTER — Encounter (HOSPITAL_COMMUNITY): Payer: Self-pay | Admitting: Physician Assistant

## 2016-12-30 DIAGNOSIS — K649 Unspecified hemorrhoids: Secondary | ICD-10-CM

## 2016-12-30 DIAGNOSIS — I2510 Atherosclerotic heart disease of native coronary artery without angina pectoris: Secondary | ICD-10-CM

## 2016-12-30 DIAGNOSIS — Z9861 Coronary angioplasty status: Secondary | ICD-10-CM

## 2016-12-30 DIAGNOSIS — I7121 Aneurysm of the ascending aorta, without rupture: Secondary | ICD-10-CM

## 2016-12-30 DIAGNOSIS — I712 Thoracic aortic aneurysm, without rupture: Secondary | ICD-10-CM

## 2016-12-30 DIAGNOSIS — I739 Peripheral vascular disease, unspecified: Secondary | ICD-10-CM

## 2016-12-30 DIAGNOSIS — K760 Fatty (change of) liver, not elsewhere classified: Secondary | ICD-10-CM

## 2016-12-30 DIAGNOSIS — R7303 Prediabetes: Secondary | ICD-10-CM

## 2016-12-30 DIAGNOSIS — I2699 Other pulmonary embolism without acute cor pulmonale: Principal | ICD-10-CM

## 2016-12-30 LAB — PROTIME-INR
INR: 1.09
PROTHROMBIN TIME: 14.1 s (ref 11.4–15.2)

## 2016-12-30 LAB — CBC
HCT: 40.8 % (ref 39.0–52.0)
Hemoglobin: 13.3 g/dL (ref 13.0–17.0)
MCH: 29.4 pg (ref 26.0–34.0)
MCHC: 32.6 g/dL (ref 30.0–36.0)
MCV: 90.3 fL (ref 78.0–100.0)
PLATELETS: 174 10*3/uL (ref 150–400)
RBC: 4.52 MIL/uL (ref 4.22–5.81)
RDW: 14.2 % (ref 11.5–15.5)
WBC: 6.3 10*3/uL (ref 4.0–10.5)

## 2016-12-30 LAB — GLUCOSE, CAPILLARY
Glucose-Capillary: 109 mg/dL — ABNORMAL HIGH (ref 65–99)
Glucose-Capillary: 113 mg/dL — ABNORMAL HIGH (ref 65–99)

## 2016-12-30 LAB — HEPARIN LEVEL (UNFRACTIONATED)
Heparin Unfractionated: 0.61 IU/mL (ref 0.30–0.70)
Heparin Unfractionated: 0.54 IU/mL (ref 0.30–0.70)

## 2016-12-30 LAB — HIV ANTIBODY (ROUTINE TESTING W REFLEX): HIV SCREEN 4TH GENERATION: NONREACTIVE

## 2016-12-30 MED ORDER — WITCH HAZEL-GLYCERIN EX PADS
MEDICATED_PAD | CUTANEOUS | Status: DC | PRN
  Filled 2016-12-30: qty 100

## 2016-12-30 MED ORDER — HEPARIN (PORCINE) IN NACL 100-0.45 UNIT/ML-% IJ SOLN
1950.0000 [IU]/h | INTRAMUSCULAR | Status: AC
  Administered 2016-12-30: 2100 [IU]/h via INTRAVENOUS
  Filled 2016-12-30 (×2): qty 250

## 2016-12-30 MED ORDER — MELATONIN 3 MG PO TABS
3.0000 mg | ORAL_TABLET | Freq: Every evening | ORAL | Status: DC | PRN
  Administered 2016-12-30: 3 mg via ORAL
  Filled 2016-12-30 (×3): qty 1

## 2016-12-30 MED ORDER — WARFARIN SODIUM 2.5 MG PO TABS: 12.5000 mg | ORAL_TABLET | Freq: Once | ORAL | Status: DC

## 2016-12-30 MED ORDER — RIVAROXABAN 15 MG PO TABS: 15.0000 mg | ORAL_TABLET | Freq: Two times a day (BID) | ORAL | Status: DC

## 2016-12-30 MED ORDER — HYDROCORTISONE ACETATE 25 MG RE SUPP
25.0000 mg | Freq: Two times a day (BID) | RECTAL | Status: DC
  Filled 2016-12-30: qty 1

## 2016-12-30 MED ORDER — RIVAROXABAN 20 MG PO TABS: 20.0000 mg | ORAL_TABLET | Freq: Every day | ORAL | Status: DC

## 2016-12-30 NOTE — Care Management Note (Addendum)
Case Management Note  Patient Details  Name: Cory PeonRichard T Nolan MRN: [id number] Date of Birth: 1955-07-04  Subjective/Objective:  Pt presented for SOB- Positive for Pulmonary Embolus due to being off Coumadin for  2 weeks 2/2 bleeding hemorrhoids. Pt is without insurance at this time.  PTA- Independent- no home needs identified.                  Action/Plan: CM did get a self pay cost for Enoxaparin and will be around $1407.99. CM will speak with pt in regards to cost.   Expected Discharge Date:  12/30/16               Expected Discharge Plan:  Home/Self Care  In-House Referral:  NA  Discharge planning Services  CM Consult, Medication Assistance  Post Acute Care Choice:  NA Choice offered to:  NA  DME Arranged:  N/A DME Agency:  NA  HH Arranged:  NA HH Agency:  NA  Status of Service:  Completed, signed off  If discussed at Long Length of Stay Meetings, dates discussed:    Additional Comments: 1637 12-31-16 [name], RN,BSN [phone] CM did speak with Pharmacist Shanda BumpsJessica. Jessica discussed Xarelto with pt and provided pt 30 day free Xarelto card. CM did go back in the room to discuss the patient assistance application with the patient. Pt will need to fax completed forms and tax forms to the company. [facility] [place] and medication is not available. [facility] medications available. CM will make pt aware.    1210 12-31-16 [name], RN,BSN [phone] CM [facility] discussed that he would be provided 30 day free supply of Xarelto. Pt felt like he would be able to get more of the Xarelto. CM explained that he would have to fill out the patient assistance application to see if he qualifies for year supply free. Pt stated when he tried that with [employer] it took over 5 months to hear something from company. He would rather stay on Coumadin and d/c with the Lovenox. Pt agitated at the time of visit. Has been NPO for  GI to consult. CM did Amion page MD. No further needs from CM at this time.    1638 12-30-16 [name], RN,BSN [phone] CM discussed with attending and Cardiology-plan will be to use Xarelto for home. CM will provide pt with 30 day free card and the patient assistance application for Xarelto will be found in the shadow chart for MD to fill out. Pt may be eligible for assistance via the company.   Gala LewandowskyGraves-Bigelow, Ridhi Hoffert Kaye, RN 12/30/2016, 2:06 PM

## 2016-12-30 NOTE — Consult Note (Signed)
Cardiology Consultation:   Patient ID: Cory Nolan; [id number]; 1955/11/12   Admit date: 12/29/2016 Date of Consult: 12/30/2016  Primary Care Provider: Tommie Sams, DO Primary Cardiologist: Cory Nolan 10/23/2016 L. Diona Fanti, [facility] 12/29/2016 Primary Electrophysiologist:  n/a   Patient Profile:   Cory Nolan is a 61 y.o. male with a hx of bilat PE 03/13/2015 after PCI, DES x 2 RCA 03/06/2015, EF 55-60% by echo 02/2015, DM, HLD, morbid obesity, OSA, remote tob use, hepatic steatosis on statins, who is being seen today for the evaluation of PE at the request of Cory Butler Denmark.   History of Present Illness:   Cory Nolan had a PE 2 days after d/c from PCI. He was placed on coumadin at the time. He developed rectal bleeding and was to have a colonoscopy. He was taken off the coumadin for the procedure.   He was seen by Cory Nolan, [facility] 07/10 and was having SOB>>sent to ER for CT>>bilateral PE seen and pt admitted for anticoagulation.  Mr Ramcharan notes that he had a DVT after he was on the coumadin, in 04/2015. However, he admits that the coumadin was not as consistent as it could have been and he might not have been therapeutic.   He was never on ASA, just Brilinta and coumadin after the PCI. When the Brilinta was stopped, he was on ASA and coumadin.  He had rectal bleeding 11/23/2016. He stopped the coumadin after that. It took several days to get the rectal bleeding to stop. He was seen by his PCP and by GI. He was told he has an ulcerated lesion, ?ulcerated hemorrhoid and needs a colonoscopy. This is scheduled for later this month.   The SOB started about 4 days ago. He is not that active but goes to the gym on occasion, runs his dogs 3-4 x week, hunts in season, works on cars and farm machinery, and is working on his home. Other than driving to [place], no prolonged rides or immobilization.   Past Medical History:  Diagnosis Date  . Bilateral pulmonary embolism, multiple 03/13/15 03/08/15    . Coronary artery disease 03/08/15  . Diabetes mellitus without complication (HCC)   . Hepatic steatosis 03/19/2015  . Hyperlipidemia with target LDL less than 70 03/18/2015  . Iliac artery aneurysm, right (HCC) 03/19/2015  . Morbid obesity (HCC)   . Prediabetes 03/18/2015  . S/P angioplasty with stent-RCA prox and distal-DES 03/06/15 03/08/15    Past Surgical History:  Procedure Laterality Date  . CARDIAC CATHETERIZATION N/A 03/08/2015   Procedure: Left Heart Cath and Coronary Angiography;  Surgeon: Peter M Swaziland, MD;  Location: [facility] INVASIVE CV LAB;  Service: Cardiovascular;  Laterality: N/A;  . KNEE SURGERY       Inpatient Medications: Scheduled Meds: . aspirin EC  81 mg Oral Daily  . hydrocortisone  25 mg Rectal BID  . linaclotide  290 mcg Oral QAC breakfast  . metoprolol succinate  25 mg Oral QHS  . sodium chloride flush  3 mL Intravenous Q12H  . warfarin  12.5 mg Oral ONCE-1800  . Warfarin - Pharmacist Dosing Inpatient   Does not apply q1800   Continuous Infusions: . heparin 2,100 Units/hr (12/29/16 2028)   PRN Meds: acetaminophen **OR** acetaminophen, ondansetron **OR** ondansetron (ZOFRAN) IV Prior to Admission medications   Medication Sig  aspirin EC 81 MG tablet Take 81 mg by mouth daily.  diphenhydramine-acetaminophen (TYLENOL PM) 25-500 MG TABS tablet Take 2 tablets by mouth at bedtime as needed.  ezetimibe (ZETIA) 10 MG tablet Take 1 tablet (10 mg total) by mouth daily.  Glucosamine HCl (GLUCOSAMINE PO) Take 1 capsule by mouth daily.  hydrocortisone cream 1 % Apply 1 application topically as needed for itching.  linaclotide (LINZESS) 290 MCG CAPS capsule Take 290 mcg by mouth daily before breakfast.  metoprolol succinate (TOPROL-XL) 25 MG 24 hr tablet TAKE 1 TABLET BY MOUTH DAILY. Patient taking differently: TAKE 1 TABLET AT BEDTIME  Omega-3 Fatty Acids (FISH OIL PO) Take 1 capsule by mouth daily.   warfarin (COUMADIN) 5 MG tablet Take 5-10 mg by mouth daily.   acetaminophen (TYLENOL) 650 MG CR tablet Take 1,300 mg by mouth every 8 (eight) hours as needed for pain.   nitroGLYCERIN (NITROSTAT) 0.4 MG SL tablet Place 1 tablet (0.4 mg total) under the tongue every 5 (five) minutes x 3 doses as needed for chest pain.    Allergies:   No Known Allergies  Social History:   Social History   Social History  . Marital status: Unknown    Spouse name: N/A  . Number of children: N/A  . Years of education: N/A   Occupational History  . Unemployed, filing for disability    Social History Main Topics  . Smoking status: Former Smoker    Years: 40.00    Quit date: 07/12/2007  . Smokeless tobacco: Current User    Types: Chew  . Alcohol use 0.0 oz/week     Comment: occ  . Drug use: No  . Sexual activity: No   Other Topics Concern  . Not on file   Social History Narrative   Lives alone.    Family History:   The patient's family history includes Bone cancer in his mother; Breast cancer in his mother; Dementia in his father; Heart attack in his mother; Heart disease in his mother; Sudden death (age of onset: 39) in his paternal grandfather. Pt indicated that his mother is deceased. He indicated that his father is deceased. He indicated that his maternal grandmother is deceased. He indicated that his maternal grandfather is deceased. He indicated that his paternal grandmother is deceased. He indicated that his paternal grandfather is deceased.    ROS:  Please see the history of present illness.  All other ROS reviewed and negative.      Physical Exam/Data:   Vitals:   12/30/16 0659 12/30/16 0704 12/30/16 1342 12/30/16 1357  BP: (!) 98/39 114/72 117/67   Pulse:   69   Resp:   18   Temp:   98.1 F (36.7 C)   TempSrc:   Oral   SpO2:   97% 97%  Weight:      Height:        Intake/Output Summary (Last 24 hours) at 12/30/16 1634 Last data filed at 12/30/16 1343  Gross per 24 hour  Intake            902.2 ml  Output             1250 ml   Net           -347.8 ml   Filed Weights   12/30/16 0029 12/30/16 0648  Weight: (!) 345 lb 3.2 oz (156.6 kg) (!) 345 lb (156.5 kg)   Body mass index is 44.3 kg/m.  General:  Well nourished, well developed, in no acute distress HEENT: normal Lymph: no adenopathy Neck: no JVD seen, difficult to assess 2nd body habitus Endocrine:  No thryomegaly Vascular: No carotid bruits; peripheral pulses  2+ bilaterally   Cardiac:  normal S1, S2; RRR; no murmur  Lungs:  clear to auscultation bilaterally, no wheezing, rhonchi or rales  Abd: soft, nontender, no hepatomegaly  Ext: no edema Musculoskeletal:  No deformities, BUE and BLE strength normal and equal Skin: warm and dry  Neuro:  CNs 2-12 intact, no focal abnormalities noted Psych:  Normal affect   EKG:  The EKG was personally reviewed and demonstrates:  SR, PVC, HR 73 Telemetry:  Telemetry was personally reviewed and demonstrates:  SR  Relevant CV Studies: Echo ordered  Laboratory Data:  Chemistry  Recent Labs Lab 12/29/16 1728  NA 134*  K 4.1  CL 101  CO2 25  GLUCOSE 103*  BUN 11  CREATININE 0.84  CALCIUM 9.3  GFRNONAA >60  GFRAA >60  ANIONGAP 8    Hematology  Recent Labs Lab 12/29/16 1728 12/30/16 0016  WBC 6.2 6.3  RBC 4.64 4.52  HGB 13.7 13.3  HCT 41.4 40.8  MCV 89.2 90.3  MCH 29.5 29.4  MCHC 33.1 32.6  RDW 14.0 14.2  PLT 178 174   Cardiac Enzymes   Recent Labs Lab 12/29/16 1744  TROPIPOC 0.00    DDimer   Recent Labs Lab 12/29/16 1055  DDIMER 2.28*    Radiology/Studies:  Ct Angio Chest Pe W And/or Wo Contrast Result Date: 12/29/2016 CLINICAL DATA:  Shortness of breath history of PE EXAM: CT ANGIOGRAPHY CHEST WITH CONTRAST TECHNIQUE: Multidetector CT imaging of the chest was performed using the standard protocol during bolus administration of intravenous contrast. Multiplanar CT image reconstructions and MIPs were obtained to evaluate the vascular anatomy. CONTRAST:  100 mL Isovue 370  intravenous COMPARISON:  03/13/2015 FINDINGS: Cardiovascular: Satisfactory opacification of the pulmonary arteries to the segmental level. Multiple filling defects within right upper, right middle and right lower lobe pulmonary arterial branches. Multifocal filling defects within left lower lobe and left upper lobe segmental and subsegmental pulmonary artery branches consistent with acute pulmonary embolus. RV LV ratio is elevated at 1.0 consistent with right heart strain. Mild cardiomegaly. No pericardial effusion. Coronary artery calcification. Enlarged pulmonary artery trunk at 3.4 cm. Aortic atherosclerosis. Mild aneurysmal dilatation of ascending aorta up to 4.4 cm. Mediastinum/Nodes: No enlarged mediastinal, hilar, or axillary lymph nodes. Thyroid gland, trachea, and esophagus demonstrate no significant findings. Lungs/Pleura: Lungs are clear. No pleural effusion or pneumothorax. Upper Abdomen: Hepatic steatosis.  No acute abnormality. Musculoskeletal: Degenerative changes. No acute or suspicious bone lesion. Review of the MIP images confirms the above findings. IMPRESSION: 1. Multiple bilateral acute pulmonary emboli. Positive for acute PE with CT evidence of right heart strain (RV/LV Ratio = 1.0) consistent with at least submassive (intermediate risk) PE. The presence of right heart strain has been associated with an increased risk of morbidity and mortality. Please activate Code PE by paging [phone]. 2. Aneurysmal dilatation of the ascending aorta up to 4.4 cm Recommend annual imaging followup by CTA or MRA. This recommendation follows 2010 ACCF/AHA/AATS/ACR/ASA/SCA/SCAI/SIR/STS/SVM Guidelines for the Diagnosis and Management of Patients with Thoracic Aortic Disease. Circulation. 2010; 121: O962-X528 Aortic Atherosclerosis (ICD10-I70.0). Critical Value/emergent results were called by telephone at the time of interpretation on 12/29/2016 at 7:53 pm to Cory. Alvira Monday , who verbally acknowledged these  results. Electronically Signed   By: Jasmine Pang M.D.   On: 12/29/2016 19:54    Assessment and Plan:   Principal Problem: 1.  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 - Change to Xarelto when ok with GI, feel compliance w/ daily rx will be better. - 2D echo pending to assess RV  2. Dilated ascending Aortic aneurysm:  - On CT chest 2016, aorta was normal  - previously had R iliac artery aneurysm 2.7 cm - CT 09/02/2016 showed Inferior abdominal aortic ectasia  measuring 3.1 cm maximal diameter >new - quit tob 10 years ago, still chews  3. CAD s/p PCI of RCA 2016: No recent cholesterol check  - no ischemic sx - continue ASA, BB, Zetia - check FLP - apparently is not on statin due to hepatic steatosis  4.  GI Bleed 11/2016 - felt secondary to ? ulcerated hemorrhoid and colonoscopy recommended.  He was set up for outpt colonoscopy and does not want it done at the hospital due to cost. - Will get GI consult as he will need to be on un interupted anticoagulation for 6 months - I think he needs to have colonoscopy while on Heparin which can be stopped transiently for procedure and then if ok can transition to Xarelto.  Otherwise, per IM Active Problems:   CAD S/P PCI with DES to RCA 03/08/15   Prediabetes   Hepatic steatosis   Morbid obesity (HCC)   PVD (peripheral vascular disease) (HCC)   Ascending aortic aneurysm - 4/4 cm   Hemorrhoids   Pulmonary emboli [facility])  Signed, Theodore Demark, PA-C  12/30/2016 4:34 PM  Patient seen and independently examined with Theodore Demark, PA. We discussed all aspects of the encounter. I agree with the assessment and plan as stated above.  Patient well known to [place] with a history of CAD with PCI of the RCA 02/2015 and 2 days after PCI developed SOB and was found to have bilateral PEs and was placed on  Coumadin.  He apparently he was not compliant with his coumadin and had a DVT in 04/2017.  He then had rectal bleeding last month and stopped his coumadin.  He presented to Cardiology clinic with complaints of SOB and a d-dimer was elevated at 2.22 and he was sent for Chest CT which showed bilateral submassive PEs with RV strain.  He was admitted to Medicine and Cards is now asked to see.  He denies any chest pain or pressure or LE edema.  He says that he was seen by [facility] and source of Gi bleed last month felt to be ulcerated hemorrhoid but colonoscopy was recommended.  He is set up for outpt procedure in their clinic because it is cheaper than the hospital.  In regards to recurrent PEs, he states that he does have a family history of possible PE with 2 family members dying of what was thought to be blood clots that "broke off".  On exam his lungs are CTA, heart is RRR with no M/R/G and no edema on LE.    He has never had a coagulopathy workup and has no risk factors for recurrent DVT/PE except for morbid obesity.  He has not been on any long trips except to [place].  I think at this point he should have a coagulopathy workup by Heme.  We have spoken with Cory. Mosetta Putt who recommended that if ok with Gi we transition him to Xarelto and have him followup outpt for full coagulopathy workup.  In regards to his recent rectal bleed, it was felt that it was due to an ulcerated hemorrhoid but  recommended that colonoscopy be done.  I discussed this with the patient and recommended that [facility] as he will need to be on un interrupted anticoagulation for 6 months and likely will be required to hold anticoagulation for colonoscopy and it would be safer to do this while on Heparin.  GI has been consulted and await recs from them.  Signed: Armanda Magicraci Turner, MD [facility] 12/30/2016

## 2016-12-30 NOTE — Discharge Instructions (Addendum)
DO NOT STOP XARELTO FOR COLONOSCOPY- IF HEMORRHOIDAL BLEEDING RECURRS AND DOES NOT STOP, COME TO THE ER  Please take all your medications with you for your next visit with your Primary MD. Please request your Primary MD to go over all hospital test results at the follow up. Please ask your Primary MD to get all Hospital records sent to his/her office.  If you experience worsening of your admission symptoms, develop shortness of breath, chest pain, suicidal or homicidal thoughts or a life threatening emergency, you must seek medical attention immediately by calling 911 or calling your MD.  Cory QuinYou must read the complete instructions/literature along with all the possible adverse reactions/side effects for all the medicines you take including new medications that have been prescribed to you. Take new medicines after you have completely understood and accpet all the possible adverse reactions/side effects.   Do not drive when taking pain medications or sedatives.    Do not take more than prescribed Pain, Sleep and Anxiety Medications  If you have smoked or chewed Tobacco in the last 2 yrs please stop. Stop any regular alcohol and or recreational drug use.  Wear Seat belts while driving.   Information on my medicine - XARELTO (rivaroxaban)  This medication education was reviewed with me or my healthcare representative as part of my discharge preparation.  The pharmacist that spoke with me during my hospital stay was:  Cory Cory Nolan, Cory Cory Nolan, [facility]  WHY WAS Cory HurlXARELTO PRESCRIBED FOR YOU? Xarelto was prescribed to treat blood clots that may have been found in the veins of your legs (deep vein thrombosis) or in your lungs (pulmonary embolism) and to reduce the risk of them occurring again.  What do you need to know about Xarelto? The starting dose is one 15 mg tablet taken TWICE daily with food for the FIRST 21 DAYS then on Friday 01/22/2017  the dose is changed to one 20 mg tablet taken ONCE A DAY  with your evening meal.  DO NOT stop taking Xarelto without talking to the health care provider who prescribed the medication.  Refill your prescription for 20 mg tablets before you run out.  After discharge, you should have regular check-up appointments with your healthcare provider that is prescribing your Xarelto.  In the future your dose may need to be changed if your kidney function changes by a significant amount.  What do you do if you miss a dose? If you are taking Xarelto TWICE DAILY and you miss a dose, take it as soon as you remember. You may take two 15 mg tablets (total 30 mg) at the same time then resume your regularly scheduled 15 mg twice daily the next day.  If you are taking Xarelto ONCE DAILY and you miss a dose, take it as soon as you remember on the same day then continue your regularly scheduled once daily regimen the next day. Do not take two doses of Xarelto at the same time.   Important Safety Information Xarelto is a blood thinner medicine that can cause bleeding. You should call your healthcare provider right away if you experience any of the following: ? Bleeding from an injury or your nose that does not stop. ? Unusual colored urine (red or dark Cory Nolan) or unusual colored stools (red or black). ? Unusual bruising for unknown reasons. ? A serious fall or if you hit your head (even if there is no bleeding).  Some medicines may interact with Xarelto and might increase your risk of bleeding  while on Xarelto. To help avoid this, consult your healthcare provider or pharmacist prior to using any new prescription or non-prescription medications, including herbals, vitamins, non-steroidal anti-inflammatory drugs (NSAIDs) and supplements.  This website has more information on Xarelto: [email or web address]

## 2016-12-30 NOTE — Progress Notes (Signed)
ANTICOAGULATION CONSULT NOTE - Follow-up Consult  Pharmacy Consult for Heparin Indication: pulmonary embolus  No Known Allergies  Patient Measurements: Weight: 156.9 kg Height: 6'1" IBW: 74 kg Heparin Dosing Weight: 117 kg  Assessment: 61 year old male with history of blood clots who presented to ED with chest pain and shortness of breath after being off Coumadin for 2 weeks secondary to bleeding hemorrhoids in preparation for colonscopy. CT angio is positive for multiple bilateral acute pulmonary emboli with RV/LV ratio of 1. CBC is within normal limits. No bleeding. SCr 0.84.   First heparin level therapeutic at 0.61.  Goal of Therapy:  Heparin level 0.3-0.7 units/ml Monitor platelets by anticoagulation protocol: Yes   Plan:  Continue heparin gtt 2,100 units/hr Check confirmatory heparin level Monitor daily heparin level, CBC, s/s of bleed  Enzo BiNathan Feleica Fulmore, PharmD, BCPS Clinical Pharmacist Pager [phone] 12/30/2016 3:13 AM

## 2016-12-30 NOTE — Progress Notes (Signed)
When time to give 2200 meds, Pt stated that he had already taken his home meds. When RN inquired further, pt said he had his home meds with him and had been taking them on his own. RN asked for the medications and explained to pt that he can take his home meds, but they have to be counted and sent to pharmacy. Meds have been sent to pharmacy.

## 2016-12-30 NOTE — Progress Notes (Addendum)
PROGRESS NOTE    Cory Nolan   WUJ:[id number]  DOB: [birth year]  DOA: 12/29/2016 PCP: Tommie Sams, DO   Brief Narrative:  Cory Nolan is a 61 y/o male with h/o b/l PEs (9/16) on life long coumadin, fatty liver, CAD s/p DES stent in RCA (9/16), HLD, morbid obesity.  Recently referred to GI for rectal bleed and constipation. Stopped Coumadin on 6/2 due to bleeding hemorrhoids and chose not to resume Coumadin until after colonoscopy on 7/7. Recommended by cardiology to resume Coumadin if Colonoscopy documented no further risk for bleed. 7/10- seen by [facility] Cardiology for dyspnea for 4 days and found to have an elevated D dimer at 2.28. Referred to ER for CTA and found to have multiple bilateral acute PE. CT evidence of right heart strain. No chest pain and troponin negative. BP 127/77, HR 63. Also found to have Ascending aortic aneurysm 4.4 cm.    Subjective: Has not had any chest pain. Dyspnea is improving. No cough. No nausea, vomiting constipation and diarrhea.   Assessment & Plan:   Principal Problem:   Recurrent pulmonary emboli (HCC) - symptoms are improving- needs life long anticoagulation - cont Heparin infusion- plan to switch to Lovenox tomorrow and d/c home with Coumadin and Lovenox  - d/w patient - ADDENDUM: cannot afford Lovenox ( > $1000) and each INR visit is $40- have discussed switching to NOAC with patient, cardiology and case management- - cardiology follows his INRs as outpt and agree with NOAC- starting Xarelto today- case management to provide 30 day free card and start paperwork for assistance from manufacturer- also may be able to obtain samples from cardiology who follows him  Active Problems: Hemorrhoids, constipation - Anusol, sitz baths - has samples of Linzess from GI - hold off on Colonoscopy in setting of acute PEs    CAD S/P PCI with DES to RCA 03/08/15 - aspirin, Toprol    Prediabetes - sugars stable- he does not want CBGs- will d/c   Hepatic steatosis   Morbid obesity- Body mass index is 44.3 kg/m. 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   DVT prophylaxis: Heparin Code Status: full code Family Communication:  Disposition Plan: home in 24 hrs Consultants:    Procedures:    Antimicrobials:  Anti-infectives    None       Objective: Vitals:   12/30/16 0648 12/30/16 0659 12/30/16 0704 12/30/16 1342  BP: (!) 89/54 (!) 98/39 114/72 117/67  Pulse: 63   69  Resp: 17   18  Temp: 98.2 F (36.8 C)   98.1 F (36.7 C)  TempSrc: Oral   Oral  SpO2: 97%   97%  Weight: (!) 156.5 kg (345 lb)     Height:        Intake/Output Summary (Last 24 hours) at 12/30/16 1357 Last data filed at 12/30/16 1343  Gross per 24 hour  Intake            902.2 ml  Output             1250 ml  Net           -347.8 ml   Filed Weights   12/30/16 0029 12/30/16 0648  Weight: (!) 156.6 kg (345 lb 3.2 oz) (!) 156.5 kg (345 lb)  Examination: General exam: Appears comfortable  HEENT:  Right pupil with no abnormalities, oral mucosa moist, no sclera icterus or thrush Respiratory system: Clear to auscultation. Respiratory effort normal. Cardiovascular system: S1 & S2 heard, RRR.  No murmurs  Gastrointestinal system: Abdomen soft, non-tender, nondistended. Normal bowel sound. No organomegaly Central nervous system: Alert and oriented. No focal neurological deficits. Extremities: No cyanosis, clubbing or edema Skin: No rashes or ulcers Psychiatry:  Mood & affect appropriate.     Data Reviewed: I have personally reviewed following labs and imaging studies  CBC:  Recent Labs Lab 12/29/16 1728 12/30/16 0016  WBC 6.2 6.3  HGB 13.7 13.3  HCT 41.4 40.8  MCV 89.2 90.3  PLT 178 174   Basic Metabolic Panel:  Recent Labs Lab 12/29/16 1728  NA 134*  K 4.1  CL 101  CO2 25  GLUCOSE 103*    BUN 11  CREATININE 0.84  CALCIUM 9.3   GFR: Estimated Creatinine Clearance: 144.3 mL/min (by C-G formula based on SCr of 0.84 mg/dL). Liver Function Tests: No results for input(s): AST, ALT, ALKPHOS, BILITOT, PROT, ALBUMIN in the last 168 hours. No results for input(s): LIPASE, AMYLASE in the last 168 hours. No results for input(s): AMMONIA in the last 168 hours. Coagulation Profile:  Recent Labs Lab 12/29/16 1728 12/30/16 0238  INR 1.00 1.09   Cardiac Enzymes: No results for input(s): CKTOTAL, CKMB, CKMBINDEX, TROPONINI in the last 168 hours. BNP (last 3 results) No results for input(s): PROBNP in the last 8760 hours. HbA1C: No results for input(s): HGBA1C in the last 72 hours. CBG:  Recent Labs Lab 12/30/16 0759 12/30/16 1159  GLUCAP 109* 113*   Lipid Profile: No results for input(s): CHOL, HDL, LDLCALC, TRIG, CHOLHDL, LDLDIRECT in the last 72 hours. Thyroid Function Tests: No results for input(s): TSH, T4TOTAL, FREET4, T3FREE, THYROIDAB in the last 72 hours. Anemia Panel: No results for input(s): VITAMINB12, FOLATE, FERRITIN, TIBC, IRON, RETICCTPCT in the last 72 hours. Urine analysis:    Component Value Date/Time   COLORURINE YELLOW 09/02/2016 1840   APPEARANCEUR CLEAR 09/02/2016 1840   LABSPEC 1.025 09/02/2016 1840   PHURINE 6.0 09/02/2016 1840   GLUCOSEU NEGATIVE 09/02/2016 1840   HGBUR NEGATIVE 09/02/2016 1840   BILIRUBINUR NEGATIVE 09/02/2016 1840   KETONESUR NEGATIVE 09/02/2016 1840   PROTEINUR NEGATIVE 09/02/2016 1840   NITRITE NEGATIVE 09/02/2016 1840   LEUKOCYTESUR NEGATIVE 09/02/2016 1840   Sepsis Labs: @LABRCNTIP (procalcitonin:4,lacticidven:4) )No results found for this or any previous visit (from the past 240 hour(s)).       Radiology Studies: Ct Angio Chest Pe W And/or Wo Contrast  Result Date: 12/29/2016 CLINICAL DATA:  Shortness of breath history of PE EXAM: CT ANGIOGRAPHY CHEST WITH CONTRAST TECHNIQUE: Multidetector CT imaging of  the chest was performed using the standard protocol during bolus administration of intravenous contrast. Multiplanar CT image reconstructions and MIPs were obtained to evaluate the vascular anatomy. CONTRAST:  100 mL Isovue 370 intravenous COMPARISON:  03/13/2015 FINDINGS: Cardiovascular: Satisfactory opacification of the pulmonary arteries to the segmental level. Multiple filling defects within right upper, right middle and right lower lobe pulmonary arterial branches. Multifocal filling defects within left lower lobe and left upper lobe segmental and subsegmental pulmonary artery branches consistent with acute pulmonary embolus. RV LV ratio is elevated at 1.0 consistent with right heart strain. Mild cardiomegaly. No pericardial effusion. Coronary artery calcification. Enlarged pulmonary artery trunk at 3.4 cm. Aortic atherosclerosis. Mild aneurysmal dilatation of ascending aorta up to 4.4 cm. Mediastinum/Nodes: No enlarged  mediastinal, hilar, or axillary lymph nodes. Thyroid gland, trachea, and esophagus demonstrate no significant findings. Lungs/Pleura: Lungs are clear. No pleural effusion or pneumothorax. Upper Abdomen: Hepatic steatosis.  No acute abnormality. Musculoskeletal: Degenerative changes. No acute or suspicious bone lesion. Review of the MIP images confirms the above findings. IMPRESSION: 1. Multiple bilateral acute pulmonary emboli. Positive for acute PE with CT evidence of right heart strain (RV/LV Ratio = 1.0) consistent with at least submassive (intermediate risk) PE. The presence of right heart strain has been associated with an increased risk of morbidity and mortality. Please activate Code PE by paging [phone]. 2. Aneurysmal dilatation of the ascending aorta up to 4.4 cm Recommend annual imaging followup by CTA or MRA. This recommendation follows 2010 ACCF/AHA/AATS/ACR/ASA/SCA/SCAI/SIR/STS/SVM Guidelines for the Diagnosis and Management of Patients with Thoracic Aortic Disease.  Circulation. 2010; 121: G956-O130 Aortic Atherosclerosis (ICD10-I70.0). Critical Value/emergent results were called by telephone at the time of interpretation on 12/29/2016 at 7:53 pm to Dr. Alvira Monday , who verbally acknowledged these results. Electronically Signed   By: Jasmine Pang M.D.   On: 12/29/2016 19:54      Scheduled Meds: . aspirin EC  81 mg Oral Daily  . linaclotide  290 mcg Oral QAC breakfast  . metoprolol succinate  25 mg Oral QHS  . sodium chloride flush  3 mL Intravenous Q12H  . warfarin  12.5 mg Oral ONCE-1800  . Warfarin - Pharmacist Dosing Inpatient   Does not apply q1800   Continuous Infusions: . heparin 2,100 Units/hr (12/29/16 2028)     LOS: 0 days    Time spent in minutes: 35    Calvert Cantor, MD [facility] Pager: [email or web address] Password [email or web address] 12/30/2016, 1:57 PM

## 2016-12-30 NOTE — Progress Notes (Signed)
ANTICOAGULATION CONSULT NOTE - Follow Up Consult  Pharmacy Consult for Heparin Indication: Coumadin  No Known Allergies  Patient Measurements: Height: 6\' 2"  (188 cm) Weight: (!) 345 lb (156.5 kg) IBW/kg (Calculated) : 82.2 Heparin Dosing Weight:   Vital Signs: Temp: 98.2 F (36.8 C) (07/11 0648) Temp Source: Oral (07/11 0648) BP: 114/72 (07/11 0704) Pulse Rate: 63 (07/11 0648)  Labs:  Recent Labs  12/29/16 1728 12/30/16 0016 12/30/16 0238 12/30/16 0918  HGB 13.7 13.3  --   --   HCT 41.4 40.8  --   --   PLT 178 174  --   --   LABPROT 13.2  --  14.1  --   INR 1.00  --  1.09  --   HEPARINUNFRC  --   --  0.61 0.54  CREATININE 0.84  --   --   --     Estimated Creatinine Clearance: 144.3 mL/min (by C-G formula based on SCr of 0.84 mg/dL).   Assessment:  Anticoag: h/o multiple B PE's (off Coumadin x 2 wks due to major hemorrhoid bleeding, now resolved). INR 1.09. HL 0.54 remains in goal. CBC WNL. CT shows multiple acute bilateral PEs. - Prior dose was 5mg  on Mondays and Fridays and 10mg  all other days.   Goal of Therapy:  Heparin level 0.3-0.7 units/ml  INR 2-3 Monitor platelets by anticoagulation protocol: Yes   Plan:  Heparin 2100 units/hr Coumadin 12.5mg  po x 1 tonight. Daily HL, CBC, and INR   Geana Walts S. Merilynn Finlandobertson, PharmD, BCPS Clinical Staff Pharmacist Pager [phone]  Misty Stanleyobertson, Gunnard Dorrance Stillinger 12/30/2016,11:29 AM

## 2016-12-30 NOTE — Progress Notes (Signed)
Spoke to Hartford FinancialHao Meng, GeorgiaPA about starting Xarelto today.  Will hold med for now and continue IV heparin until GI sees pt. Nance Pearuch Clark , [facility] aware.

## 2016-12-30 NOTE — Progress Notes (Addendum)
See Addendum below  ANTICOAGULATION CONSULT NOTE - Follow Up Consult  Pharmacy Consult : Xarelto  Indication:  multiple acute bilateral PEs and   h/o multiple B PE's  No Known Allergies  Patient Measurements: Height: 6\' 2"  (188 cm) Weight: (!) 345 lb (156.5 kg) IBW/kg (Calculated) : 82.2   Vital Signs: Temp: 98.1 F (36.7 C) (07/11 1342) Temp Source: Oral (07/11 1342) BP: 117/67 (07/11 1342) Pulse Rate: 69 (07/11 1342)  Labs:  Recent Labs  12/29/16 1728 12/30/16 0016 12/30/16 0238 12/30/16 0918  HGB 13.7 13.3  --   --   HCT 41.4 40.8  --   --   PLT 178 174  --   --   LABPROT 13.2  --  14.1  --   INR 1.00  --  1.09  --   HEPARINUNFRC  --   --  0.61 0.54  CREATININE 0.84  --   --   --     Estimated Creatinine Clearance: 144.3 mL/min (by C-G formula based on SCr of 0.84 mg/dL).   Assessment: Anticoag: h/o multiple B PE's (off Coumadin x 2 wks due to major hemorrhoid bleeding, now resolved).CBC WNL. CT showed multiple acute bilateral PEs. Per Cardiology PA, Theodore Demarkhonda Barrett, PA contacted Dr Mosetta PuttFeng with hematology, she states NOAC is appropriate.   Pharmacy consulted to switch therapy to Xarelto.    Goal of Therapy:  Heparin level 0.3-0.7 units/ml  INR 2-3 Monitor platelets by anticoagulation protocol: Yes   Plan:  Discontinue IV Heparin and coumadin now.  Xarelto 15 mg BIDWC x21 days start now (7/11 18:30 thru 8/1 at 08:00) then Xarelto 20mg  daily with supper to start 8/1 @ 20:00 --need to educate prior to discharge   Noah Delaineuth Shawntel Farnworth, RPh Clinical Pharmacist Pager: [phone] 12/30/2016,6:06 PM   ADDENDUM:  Update: RN said Dr. Mayford Knifeurner had told her earlier today that she would not start Xarelto today due to GI consult pending and pt may need colonoscopy. Cardiology PA , Theodore DemarkRhonda Barrett ordered to start Xarelto though.  RN paged cardiology- Azalee CourseHao Meng, PA said to contininue IV heparin until GI plan determined and stop Xarelto for now  Plan: Heparin drip continues at  current rate (never stopped) No coumadin (coumadin protocol dc'd & pt may need colonoscopy) F/u GI and anticoagulation plans in AM.   Noah Delaineuth Keyonna Comunale, RPh Clinical Pharmacist Pager: [phone] 12/30/2016 7:08 PM

## 2016-12-30 NOTE — Progress Notes (Signed)
Paged by nurse regarding Xarelto that was ordered. Per nurse, Dr. Mayford Knifeurner who saw the patient wished to hold Xarelto until GI eval, discussed with Dr. Herbie BaltimoreHarding on call cardiologist who also recommended the same. Informed nurse to continue IV heparin, but hold Xarelto.  Ramond DialSigned, Rykin Route PA Pager: [phone]

## 2016-12-31 ENCOUNTER — Inpatient Hospital Stay (HOSPITAL_COMMUNITY): Payer: Self-pay

## 2016-12-31 DIAGNOSIS — I2699 Other pulmonary embolism without acute cor pulmonale: Secondary | ICD-10-CM

## 2016-12-31 LAB — ECHOCARDIOGRAM COMPLETE
Ao-asc: 43 cm
E decel time: 222 msec
EERAT: 10
FS: 16 % — AB (ref 28–44)
HEIGHTINCHES: 74 in
IVS/LV PW RATIO, ED: 1.25
LA ID, A-P, ES: 33 mm
LA diam end sys: 33 mm
LA diam index: 1.14 cm/m2
LA vol A4C: 66.1 ml
LA vol index: 23.3 mL/m2
LA vol: 67.7 mL
LDCA: 2.84 cm2
LV E/e' medial: 10
LV E/e'average: 10
LV TDI E'LATERAL: 8.59
LVELAT: 8.59 cm/s
LVOTD: 19 mm
MV Dec: 222
MV Peak grad: 3 mmHg
MV pk A vel: 103 m/s
MVPKEVEL: 85.9 m/s
PW: 12.4 mm — AB (ref 0.6–1.1)
RV LATERAL S' VELOCITY: 11.2 cm/s
TAPSE: 17.7 mm
TDI e' medial: 7.83
WEIGHTICAEL: 5427.2 [oz_av]

## 2016-12-31 LAB — CBC
HCT: 38.3 % — ABNORMAL LOW (ref 39.0–52.0)
HEMOGLOBIN: 12.9 g/dL — AB (ref 13.0–17.0)
MCH: 30.1 pg (ref 26.0–34.0)
MCHC: 33.7 g/dL (ref 30.0–36.0)
MCV: 89.3 fL (ref 78.0–100.0)
Platelets: 167 10*3/uL (ref 150–400)
RBC: 4.29 MIL/uL (ref 4.22–5.81)
RDW: 14.1 % (ref 11.5–15.5)
WBC: 5.7 10*3/uL (ref 4.0–10.5)

## 2016-12-31 LAB — LIPID PANEL
Cholesterol: 163 mg/dL (ref 0–200)
HDL: 30 mg/dL — AB (ref >40)
LDL Cholesterol: 109 mg/dL — ABNORMAL HIGH (ref 0–99)
TRIGLYCERIDES: 122 mg/dL (ref <150)
Total CHOL/HDL Ratio: 5.4 RATIO
VLDL: 24 mg/dL (ref 0–40)

## 2016-12-31 LAB — HEPARIN LEVEL (UNFRACTIONATED)
HEPARIN UNFRACTIONATED: 0.73 [IU]/mL — AB (ref 0.30–0.70)
HEPARIN UNFRACTIONATED: 0.64 [IU]/mL (ref 0.30–0.70)

## 2016-12-31 LAB — PROTIME-INR
INR: 1.18
PROTHROMBIN TIME: 15.1 s (ref 11.4–15.2)

## 2016-12-31 LAB — GLUCOSE, CAPILLARY: Glucose-Capillary: 112 mg/dL — ABNORMAL HIGH (ref 65–99)

## 2016-12-31 MED ORDER — RIVAROXABAN 15 MG PO TABS
15.0000 mg | ORAL_TABLET | Freq: Two times a day (BID) | ORAL | Status: DC
  Administered 2016-12-31: 15 mg via ORAL
  Filled 2016-12-31: qty 1

## 2016-12-31 MED ORDER — HYDROCORTISONE ACETATE 25 MG RE SUPP: 25.0000 mg | Freq: Two times a day (BID) | RECTAL | 0 refills | Status: DC

## 2016-12-31 MED ORDER — PERFLUTREN LIPID MICROSPHERE
INTRAVENOUS | Status: AC
  Administered 2016-12-31: 7 mL
  Filled 2016-12-31: qty 10

## 2016-12-31 MED ORDER — RIVAROXABAN (XARELTO) VTE STARTER PACK (15 & 20 MG): ORAL_TABLET | ORAL | 0 refills | Status: DC

## 2016-12-31 MED ORDER — EZETIMIBE 10 MG PO TABS
10.0000 mg | ORAL_TABLET | Freq: Every day | ORAL | Status: DC
  Administered 2016-12-31: 10 mg via ORAL
  Filled 2016-12-31: qty 1

## 2016-12-31 NOTE — Progress Notes (Signed)
ANTICOAGULATION CONSULT NOTE - Follow Up Consult  Pharmacy Consult : Heparin >> Xarelto Indication:  multiple acute bilateral PEs and   h/o multiple B PE's  No Known Allergies  Patient Measurements: Height: 6\' 2"  (188 cm) Weight: (!) 339 lb 3.2 oz (153.9 kg) IBW/kg (Calculated) : 82.2   Vital Signs: Temp: 98 F (36.7 C) (07/12 1106) Temp Source: Oral (07/12 1106) BP: 104/62 (07/12 1106) Pulse Rate: 54 (07/12 1106)  Labs:  Recent Labs  12/29/16 1728 12/30/16 0016  12/30/16 0238 12/30/16 0918 12/31/16 0304 12/31/16 1020  HGB 13.7 13.3  --   --   --  12.9*  --   HCT 41.4 40.8  --   --   --  38.3*  --   PLT 178 174  --   --   --  167  --   LABPROT 13.2  --   --  14.1  --  15.1  --   INR 1.00  --   --  1.09  --  1.18  --   HEPARINUNFRC  --   --   < > 0.61 0.54 0.73* 0.64  CREATININE 0.84  --   --   --   --   --   --   < > = values in this interval not displayed.  Estimated Creatinine Clearance: 143 mL/min (by C-G formula based on SCr of 0.84 mg/dL).   Assessment: 61 year old male on IV heparin for multiple bilateral PE's. Now to switch to Xarelto per request from Dr. Butler Denmarkizwan.   Heparin level is therapeutic today on 1950 units/hr. Now switching to Xarelto.  CBC stable. Note did have GI bleed back in June felt to be secondary to hemorrhoids. No current overt bleeding noted. No plan for colonoscopy inpatient.   Goal of Therapy:  Heparin level 0.3-0.7 units/ml  INR 2-3 Monitor platelets by anticoagulation protocol: Yes   Plan:  Discontinue Heparin and start Xarelto at the same time this PM.  Xarelto 15 mg po BID x21 days, then 20 mg po daily.  Monitor for signs and symptoms of bleeding.  Will educate patient.   Link SnufferJessica Maegan Buller, PharmD, BCPS Clinical Pharmacist Clinical Phone 12/31/2016 until 3:30 PM - [phone] After hours, please call #[phone] 12/31/2016,2:50 PM

## 2016-12-31 NOTE — Progress Notes (Signed)
Progress Note  Patient Name: Cory PeonRichard T Wulff Date of Encounter: 12/31/2016  Primary Cardiologist: Dr. Ladonna SnideJorday  Subjective   SOB has improved.  Denies any chest pain  Inpatient Medications    Scheduled Meds: . aspirin EC  81 mg Oral Daily  . hydrocortisone  25 mg Rectal BID  . linaclotide  290 mcg Oral QAC breakfast  . metoprolol succinate  25 mg Oral QHS  . sodium chloride flush  3 mL Intravenous Q12H   Continuous Infusions: . heparin 1,950 Units/hr (12/31/16 0455)   PRN Meds: acetaminophen **OR** acetaminophen, Melatonin, ondansetron **OR** ondansetron (ZOFRAN) IV, witch hazel-glycerin   Vital Signs    Vitals:   12/30/16 1342 12/30/16 1357 12/30/16 2106 12/31/16 0428  BP: 117/67  112/67 108/68  Pulse: 69  63 (!) 59  Resp: 18     Temp: 98.1 F (36.7 C)  97.7 F (36.5 C) 97.6 F (36.4 C)  TempSrc: Oral  Oral Oral  SpO2: 97% 97% 96% 96%  Weight:    (!) 339 lb 3.2 oz (153.9 kg)  Height:        Intake/Output Summary (Last 24 hours) at 12/31/16 0918 Last data filed at 12/31/16 0733  Gross per 24 hour  Intake           1142.1 ml  Output             3395 ml  Net          -2252.9 ml   Filed Weights   12/30/16 0029 12/30/16 0648 12/31/16 0428  Weight: (!) 345 lb 3.2 oz (156.6 kg) (!) 345 lb (156.5 kg) (!) 339 lb 3.2 oz (153.9 kg)    Telemetry    NSR - Personally Reviewed  ECG    No new EKG to review- Personally Reviewed  Physical Exam   GEN: No acute distress.   Neck: No JVD Cardiac: RRR, no murmurs, rubs, or gallops.  Respiratory: Clear to auscultation bilaterally. GI: Soft, nontender, non-distended  MS: No edema; No deformity. Neuro:  Nonfocal  Psych: Normal affect   Labs    Chemistry Recent Labs Lab 12/29/16 1728  NA 134*  K 4.1  CL 101  CO2 25  GLUCOSE 103*  BUN 11  CREATININE 0.84  CALCIUM 9.3  GFRNONAA >60  GFRAA >60  ANIONGAP 8     Hematology Recent Labs Lab 12/29/16 1728 12/30/16 0016 12/31/16 0304  WBC 6.2 6.3 5.7    RBC 4.64 4.52 4.29  HGB 13.7 13.3 12.9*  HCT 41.4 40.8 38.3*  MCV 89.2 90.3 89.3  MCH 29.5 29.4 30.1  MCHC 33.1 32.6 33.7  RDW 14.0 14.2 14.1  PLT 178 174 167    Cardiac EnzymesNo results for input(s): TROPONINI in the last 168 hours.  Recent Labs Lab 12/29/16 1744  TROPIPOC 0.00     BNPNo results for input(s): BNP, PROBNP in the last 168 hours.   DDimer  Recent Labs Lab 12/29/16 1055  DDIMER 2.28*     Radiology    Ct Angio Chest Pe W And/or Wo Contrast  Result Date: 12/29/2016 CLINICAL DATA:  Shortness of breath history of PE EXAM: CT ANGIOGRAPHY CHEST WITH CONTRAST TECHNIQUE: Multidetector CT imaging of the chest was performed using the standard protocol during bolus administration of intravenous contrast. Multiplanar CT image reconstructions and MIPs were obtained to evaluate the vascular anatomy. CONTRAST:  100 mL Isovue 370 intravenous COMPARISON:  03/13/2015 FINDINGS: Cardiovascular: Satisfactory opacification of the pulmonary arteries to the segmental level. Multiple  filling defects within right upper, right middle and right lower lobe pulmonary arterial branches. Multifocal filling defects within left lower lobe and left upper lobe segmental and subsegmental pulmonary artery branches consistent with acute pulmonary embolus. RV LV ratio is elevated at 1.0 consistent with right heart strain. Mild cardiomegaly. No pericardial effusion. Coronary artery calcification. Enlarged pulmonary artery trunk at 3.4 cm. Aortic atherosclerosis. Mild aneurysmal dilatation of ascending aorta up to 4.4 cm. Mediastinum/Nodes: No enlarged mediastinal, hilar, or axillary lymph nodes. Thyroid gland, trachea, and esophagus demonstrate no significant findings. Lungs/Pleura: Lungs are clear. No pleural effusion or pneumothorax. Upper Abdomen: Hepatic steatosis.  No acute abnormality. Musculoskeletal: Degenerative changes. No acute or suspicious bone lesion. Review of the MIP images confirms the above  findings. IMPRESSION: 1. Multiple bilateral acute pulmonary emboli. Positive for acute PE with CT evidence of right heart strain (RV/LV Ratio = 1.0) consistent with at least submassive (intermediate risk) PE. The presence of right heart strain has been associated with an increased risk of morbidity and mortality. Please activate Code PE by paging [phone]. 2. Aneurysmal dilatation of the ascending aorta up to 4.4 cm Recommend annual imaging followup by CTA or MRA. This recommendation follows 2010 ACCF/AHA/AATS/ACR/ASA/SCA/SCAI/SIR/STS/SVM Guidelines for the Diagnosis and Management of Patients with Thoracic Aortic Disease. Circulation. 2010; 121: O130-Q657 Aortic Atherosclerosis (ICD10-I70.0). Critical Value/emergent results were called by telephone at the time of interpretation on 12/29/2016 at 7:53 pm to Dr. Alvira Monday , who verbally acknowledged these results. Electronically Signed   By: Jasmine Pang M.D.   On: 12/29/2016 19:54    Cardiac Studies   none  Patient Profile     61 y.o. 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    Assessment & Plan    1.  Recurrent pulmonary emboli (HCC) - no known cause - contacted Dr Mosetta Putt with hematology, she states NOAC is appropriate and no further testing needs to be done now, can be done as outpt. - Await 2D echo results to assess RV - Continue IV heparin for now and change to Xarelto when ok with GI  2. 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 CAD s/p PCI of RCA 2016: No recent cholesterol check  - denies any anginal CP and EKG nonischemic - trop neg x 1 - continue ASA, BB, Zetia - check FLP - apparently is not on statin due to hepatic steatosis  4.  GI Bleed 11/2016 - felt secondary to ? ulcerated hemorrhoid and colonoscopy recommended.  He was set up for outpt colonoscopy and does not want it done at the hospital due to cost. - GI has been consulted for help with management as he will need to be on 6 months of un interrupted anticoagulation.   - I think he needs to have colonoscopy while on Heparin which can be stopped transiently for procedure and then if ok can transition to Xarelto. - await GI  recs  If 2D echo is normal then no further recs from cardiac standpoint.  Signed, Armanda Magic, MD  12/31/2016, 9:18 AM

## 2016-12-31 NOTE — Care Management (Signed)
Patient declines colonoscopy, despite explanation of advantage of doing procedure while on short-acting anticoagulant, and is adamant that this procedure be done as outpatient elsewhere.  He also does not want [facility] physicians involved in his health care.  I tried to discuss these matters with him, and tried to convince him to go ahead and get the colonoscopy done now as inpatient, he again declines and again insists to get this done as outpatient and again emphasizes his desire not to have [facility] in [facility] care.  As a result, I have nothing to offer; please call me back if he changes his mind.

## 2016-12-31 NOTE — Progress Notes (Signed)
ANTICOAGULATION CONSULT NOTE - Follow Up Consult  Pharmacy Consult : Heparin Indication:  multiple acute bilateral PEs and   h/o multiple B PE's  No Known Allergies  Patient Measurements: Height: 6\' 2"  (188 cm) Weight: (!) 339 lb 3.2 oz (153.9 kg) IBW/kg (Calculated) : 82.2   Vital Signs: Temp: 98 F (36.7 C) (07/12 1106) Temp Source: Oral (07/12 1106) BP: 104/62 (07/12 1106) Pulse Rate: 54 (07/12 1106)  Labs:  Recent Labs  12/29/16 1728 12/30/16 0016  12/30/16 0238 12/30/16 0918 12/31/16 0304 12/31/16 1020  HGB 13.7 13.3  --   --   --  12.9*  --   HCT 41.4 40.8  --   --   --  38.3*  --   PLT 178 174  --   --   --  167  --   LABPROT 13.2  --   --  14.1  --  15.1  --   INR 1.00  --   --  1.09  --  1.18  --   HEPARINUNFRC  --   --   < > 0.61 0.54 0.73* 0.64  CREATININE 0.84  --   --   --   --   --   --   < > = values in this interval not displayed.  Estimated Creatinine Clearance: 143 mL/min (by C-G formula based on SCr of 0.84 mg/dL).   Assessment: 61 year old male on IV heparin for multiple bilateral PE's.   Heparin level is therapeutic today at 0.64 after reduction to 1950 units/hr.  CBC stable. Note did have GI bleed back in June felt to be secondary to hemorrhoids. No current overt bleeding noted.   Plan for NOAC (approved by Hematology per Dr. Norris Crossurner's progress note today) but continuing IV heparin for now until GI approves switch.   Goal of Therapy:  Heparin level 0.3-0.7 units/ml  INR 2-3 Monitor platelets by anticoagulation protocol: Yes   Plan:  Continue heparin at 1950 units/hr.  Repeat this PM to ensure does not re-accumulate as clot burden decreases. Follow-up GI plans and when ok to start NOAC.     Link SnufferJessica Donn Zanetti, PharmD, BCPS Clinical Pharmacist Clinical Phone 12/31/2016 until 3:30 PM - [phone] After hours, please call [phone] 12/31/2016,11:22 AM

## 2016-12-31 NOTE — Progress Notes (Signed)
2D echo showed normal LVF with EF 55% and ? Lateral HK, moderate LVH, and mild to moderately dilated RV with moderately reduced RVF secondary to RV strain from bilateral PEs.  He is hemodynamically stable.  No further cardiac workup at this time. Will set up repeat echo in 1 month to see if RVF improved. Will sign off.  Call with any questions.

## 2016-12-31 NOTE — Discharge Summary (Signed)
Physician Discharge Summary  Cory Nolan:[id number] DOB: April 13, 1956 DOA: 12/29/2016  PCP: Tommie Sams, DO  Admit date: 12/29/2016 Discharge date: 12/31/2016  Admitted From: home Disposition:  Home    Recommendations for Outpatient Follow-up:  1. Do not hold Xarelto for colonoscopy at least for the next 3 months   Discharge Condition:  stable CODE STATUS:  Full code   Consultations:  Cardiology   GI    Discharge Diagnoses:  Principal Problem:   Recurrent pulmonary emboli (HCC) Active Problems:   Hemorrhoids   CAD S/P PCI with DES to RCA 03/08/15   Prediabetes   Hepatic steatosis   Morbid obesity (HCC)   PVD (peripheral vascular disease) (HCC)   Ascending aortic aneurysm - 4/4 cm    Subjective:   Dyspnea nearly resolved. No cough or chest pain. No constipation, diarrhea, nausea, vomiting, cough. No other complaints.   Brief Summary: Cory Nolan is a 61 y/o male with h/o b/l PEs (9/16) on life long coumadin, fatty liver, CAD s/p DES stent in RCA (9/16), HLD, morbid obesity.  Recently referred to GI for rectal bleed and constipation. Stopped Coumadin on 6/2 due to bleeding hemorrhoids and chose not to resume Coumadin until after colonoscopy on 7/7. Recommended by cardiology to resume Coumadin if Colonoscopy documented no further risk for bleed. 7/10- seen by [facility] for dyspnea for 4 days and found to have an elevated D dimer at 2.28. Referred to ER for CTA and found to have multiple bilateral acute PE. CT evidence of right heart strain. No chest pain and troponin negative. BP 127/77, HR 63. Also found to have Ascending aortic aneurysm 4.4 cm.    Hospital Course:  Principal Problem:   Recurrent pulmonary emboli (HCC) - stopped Coumadin on 6/2 as mentioned above - symptoms are improving- needs life long anticoagulation - 2 D ECHO >> mod LVH and mod RV dysfunction- cardiology will f/u ECHO in 1 month- d/w Dr Mayford Knife - started on Heparin infusion- cannot  resume Coumadin as he cannot afford Lovenox bridge (> $1000) or further hospital stay and each INR visit is $40 - cardiology has been managing his PE with Coumadin and INRs are followed at Coumadin clinic- I have discussed switching to NOAC with patient, cardiology and case management -  starting Xarelto today- case management to provide card for 51 free days (starter pack) and start paperwork for assistance from manufacturer- also may be able to obtain samples from cardiology who follows him  Active Problems: Hemorrhoids, constipation - hemorrhoids have not bled in > 1 wk - has samples of Linzess from GI- no constipation at this time - Cardiology requested a GI eval for a colonoscopy - he was seen by Dr Dulce Sellar and stated that he did not need a colonoscopy while in the hospital due to cost issues - cont Hydrocortisone, witch hazel and sitz baths     CAD S/P PCI with DES to RCA 03/08/15 - aspirin, Toprol, Zetia    Prediabetes - sugars stable- he does not want CBGs checked in hospital     Hepatic steatosis   Morbid obesity- Body mass index is 44.3 kg/m. 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   aspirin EC 81 MG tablet Take 81 mg by mouth daily.   diphenhydramine-acetaminophen 25-500 MG Tabs tablet Commonly known as:  TYLENOL PM Take 2 tablets by mouth at bedtime as needed.   ezetimibe 10 MG tablet Commonly known as:  ZETIA Take 1 tablet (10 mg total) by mouth daily.   FISH OIL PO Take 1 capsule by  mouth daily.   GLUCOSAMINE PO Take 1 capsule by mouth daily.   hydrocortisone cream 1 % Apply 1 application topically as needed for itching.   LINZESS 290 MCG Caps capsule Generic drug:  linaclotide Take 290 mcg by mouth daily before breakfast.   metoprolol succinate 25 MG 24 hr tablet Commonly known as:  TOPROL-XL TAKE 1 TABLET BY MOUTH DAILY. What changed:  See the new instructions.   nitroGLYCERIN 0.4 MG SL tablet Commonly known as:  NITROSTAT Place 1 tablet (0.4 mg total) under the tongue every 5 (five) minutes x 3 doses as needed for chest pain.   Rivaroxaban 15 & 20 MG Tbpk Take as directed on package: Start with one 15mg  tablet by mouth twice a day with food. On Day 22, switch to one 20mg  tablet once a day with food.       No Known Allergies   Procedures/Studies: 2  D ECHO Left ventricle: Poor acoustic windows limit study LVEF is normal   at approximaty 55% with mild lateral hypokinesis. The cavity size   was normal. Wall thickness was increased in a pattern of moderate   LVH. Doppler parameters are consistent with abnormal left   ventricular relaxation (grade 1 diastolic dysfunction). - Right ventricle: The cavity size was mildly to moderately   dilated. Systolic function was moderately reduced.  Ct Angio Chest Pe W And/or Wo Contrast  Result Date: 12/29/2016 CLINICAL DATA:  Shortness of breath history of PE EXAM: CT ANGIOGRAPHY CHEST WITH CONTRAST TECHNIQUE: Multidetector CT imaging of the chest was performed using the standard protocol during bolus administration of intravenous contrast. Multiplanar CT image reconstructions and MIPs were obtained to evaluate the vascular anatomy. CONTRAST:  100 mL Isovue 370 intravenous COMPARISON:  03/13/2015 FINDINGS: Cardiovascular: Satisfactory opacification of the pulmonary arteries to the segmental level. Multiple filling defects within right upper, right middle and right lower lobe pulmonary arterial branches. Multifocal  filling defects within left lower lobe and left upper lobe segmental and subsegmental pulmonary artery branches consistent with acute pulmonary embolus. RV LV ratio is elevated at 1.0 consistent with right heart strain. Mild cardiomegaly. No pericardial effusion. Coronary artery calcification. Enlarged pulmonary artery trunk at 3.4 cm. Aortic atherosclerosis. Mild aneurysmal dilatation of ascending aorta up to 4.4 cm. Mediastinum/Nodes: No enlarged mediastinal, hilar, or axillary lymph nodes. Thyroid gland, trachea, and esophagus demonstrate no significant findings. Lungs/Pleura: Lungs are clear. No pleural effusion or pneumothorax. Upper Abdomen: Hepatic steatosis.  No acute abnormality. Musculoskeletal: Degenerative changes. No acute or suspicious bone lesion. Review of the MIP images confirms the above findings. IMPRESSION: 1. Multiple bilateral acute pulmonary emboli. Positive for acute PE with CT evidence of right heart strain (RV/LV Ratio = 1.0) consistent with at least submassive (intermediate risk) PE. The presence of right heart strain has been associated with an  increased risk of morbidity and mortality. Please activate Code PE by paging [phone]. 2. Aneurysmal dilatation of the ascending aorta up to 4.4 cm Recommend annual imaging followup by CTA or MRA. This recommendation follows 2010 ACCF/AHA/AATS/ACR/ASA/SCA/SCAI/SIR/STS/SVM Guidelines for the Diagnosis and Management of Patients with Thoracic Aortic Disease. Circulation. 2010; 121: [id number] Aortic Atherosclerosis (ICD10-I70.0). Critical Value/emergent results were called by telephone at the time of interpretation on 12/29/2016 at 7:53 pm to Dr. Alvira MondayERIN SCHLOSSMAN , who verbally acknowledged these results. Electronically Signed   By: Jasmine PangKim  Fujinaga M.D.   On: 12/29/2016 19:54       Discharge Exam: Vitals:   12/31/16 0428 12/31/16 1106  BP: 108/68 104/62  Pulse: (!) 59 (!) 54  Resp:  18  Temp: 97.6 F (36.4 C) 98 F (36.7 C)   Vitals:    12/30/16 1357 12/30/16 2106 12/31/16 0428 12/31/16 1106  BP:  112/67 108/68 104/62  Pulse:  63 (!) 59 (!) 54  Resp:    18  Temp:  97.7 F (36.5 C) 97.6 F (36.4 C) 98 F (36.7 C)  TempSrc:  Oral Oral Oral  SpO2: 97% 96% 96% 96%  Weight:   (!) 153.9 kg (339 lb 3.2 oz)   Height:        General: Pt is alert, awake, not in acute distress Cardiovascular: RRR, S1/S2 +, no rubs, no gallops Respiratory: CTA bilaterally, no wheezing, no rhonchi Abdominal: Soft, NT, ND, bowel sounds + Extremities: no edema, no cyanosis    The results of significant diagnostics from this hospitalization (including imaging, microbiology, ancillary and laboratory) are listed below for reference.     Microbiology: No results found for this or any previous visit (from the past 240 hour(s)).   Labs: BNP (last 3 results) No results for input(s): BNP in the last 8760 hours. Basic Metabolic Panel:  Recent Labs Lab 12/29/16 1728  NA 134*  K 4.1  CL 101  CO2 25  GLUCOSE 103*  BUN 11  CREATININE 0.84  CALCIUM 9.3   Liver Function Tests: No results for input(s): AST, ALT, ALKPHOS, BILITOT, PROT, ALBUMIN in the last 168 hours. No results for input(s): LIPASE, AMYLASE in the last 168 hours. No results for input(s): AMMONIA in the last 168 hours. CBC:  Recent Labs Lab 12/29/16 1728 12/30/16 0016 12/31/16 0304  WBC 6.2 6.3 5.7  HGB 13.7 13.3 12.9*  HCT 41.4 40.8 38.3*  MCV 89.2 90.3 89.3  PLT 178 174 167   Cardiac Enzymes: No results for input(s): CKTOTAL, CKMB, CKMBINDEX, TROPONINI in the last 168 hours. BNP: Invalid input(s): POCBNP CBG:  Recent Labs Lab 12/30/16 0759 12/30/16 1159 12/31/16 1100  GLUCAP 109* 113* 112*   D-Dimer  Recent Labs  12/29/16 1055  DDIMER 2.28*   Hgb A1c No results for input(s): HGBA1C in the last 72 hours. Lipid Profile  Recent Labs  12/31/16 0304  CHOL 163  HDL 30*  LDLCALC 109*  TRIG 122  CHOLHDL 5.4   Thyroid function studies No  results for input(s): TSH, T4TOTAL, T3FREE, THYROIDAB in the last 72 hours.  Invalid input(s): FREET3 Anemia work up No results for input(s): VITAMINB12, FOLATE, FERRITIN, TIBC, IRON, RETICCTPCT in the last 72 hours. Urinalysis    Component Value Date/Time   COLORURINE YELLOW 09/02/2016 1840   APPEARANCEUR CLEAR 09/02/2016 1840   LABSPEC 1.025 09/02/2016 1840   PHURINE 6.0 09/02/2016 1840   GLUCOSEU NEGATIVE 09/02/2016 1840   HGBUR NEGATIVE 09/02/2016 1840   BILIRUBINUR NEGATIVE 09/02/2016 1840  KETONESUR NEGATIVE 09/02/2016 1840   PROTEINUR NEGATIVE 09/02/2016 1840   NITRITE NEGATIVE 09/02/2016 1840   LEUKOCYTESUR NEGATIVE 09/02/2016 1840   Sepsis Labs Invalid input(s): PROCALCITONIN,  WBC,  LACTICIDVEN Microbiology No results found for this or any previous visit (from the past 240 hour(s)).   Time coordinating discharge: Over 30 minutes  SIGNED:   Calvert Cantor, MD  [facility] 12/31/2016, 5:18 PM Pager   If 7PM-7AM, please contact night-coverage [email or web address] Password [email or web address]

## 2016-12-31 NOTE — Progress Notes (Signed)
ANTICOAGULATION CONSULT NOTE - Follow Up Consult  Pharmacy Consult for heparin Indication: pulmonary embolus and DVT  No Known Allergies  Patient Measurements: Height: 6\' 2"  (188 cm) Weight: (!) 345 lb (156.5 kg) IBW/kg (Calculated) : 82.2 Heparin Dosing Weight: 117 kg  Assessment: 61 yo M on heparin for DVTs and PE. May transition to Xarelto soon. Heparin level up to 0.73 this am.  Goal of Therapy:  Heparin level 0.3-0.7 units/ml Monitor platelets by anticoagulation protocol: Yes   Plan:  Decrease heparin gtt to 1,950 units/hr Monitor daily heparin level, CBC, s/s of bleed  Cory Nolan, PharmD, BCPS Clinical Pharmacist Pager [phone] 12/31/2016 4:12 AM

## 2016-12-31 NOTE — Progress Notes (Signed)
Pt d/c home in no acute distress.  All d/c paperwork and prescriptions given to pt.  First dose of Xarelto given with dinner today.  Pt understands all medication instructions.  Will f/u with MD as instructed on d/c papers.    Pt called cab ([employer] #[phone]) who picked him up pt at main entrance.

## 2016-12-31 NOTE — Progress Notes (Signed)
  Echocardiogram 2D Echocardiogram has been performed.  Cory Nolan Cory Nolan 12/31/2016, 5:05 PM

## 2017-01-01 MED FILL — HYDROCORTISONE AC 25 MG SUP: 25 | 6 days supply | Qty: 12 | Fill #0

## 2017-01-04 ENCOUNTER — Telehealth: Payer: Self-pay | Admitting: Gastroenterology

## 2017-01-04 NOTE — Telephone Encounter (Signed)
Patient left a voice message that he couldn't do a colonoscopy due to pulmonary embolisms. He needs to know if there is an  Alternative way to do it.

## 2017-01-05 NOTE — Telephone Encounter (Signed)
Advised patient that he would not be eligible for colonoscopy procedure until at least 2 months post-PE. Medical clearance is necessary for Xarelto. Explained cologuard and the reason why people prefer the colonoscopy instead of incurring double charges.   Patient states needs lower dose of Linzess. Provided samples to patient for pick-up.  Patient states he took himself off of blood thinner while suffering from bleeding hemorrhoid. Pt states he contacted PCP and they told him 'they didn't blame him. If they were bleeding down the leg they would too'. After pt stopped blood thinner he had a PE. Advised pt against stopping medications without direction.

## 2017-01-06 ENCOUNTER — Telehealth: Payer: Self-pay

## 2017-01-06 ENCOUNTER — Telehealth: Payer: Self-pay | Admitting: Physician Assistant

## 2017-01-06 NOTE — Telephone Encounter (Signed)
F/U Call:  Patient calling and would like an explanation as to why he was referred to Hematology. I advised patient that we were working on the matter.

## 2017-01-06 NOTE — Telephone Encounter (Signed)
Spoke to patient. He was recently discharged from hospital. Recurrent PEs Now on Xarelto.   Aware he was referred to hematology but unsure who referred and why.  He had initially declined the hematology appt but then called back and left msg w them requested return call. He did agree to take appt if cancer center calls him back.  Cory DemarkRhonda Barrett saw him in hospital, pt was discharged by hospitalist. Voiced concern because he is uninsured/self-pay. I explained this referral was likely made d/t PEs but I didn't know any details about this. Pt aware I will route to [facility] for any further recommendations.     Pt expressed thanks for call. Noted he has been very impressed w [facility] - I thanked him for this feedback.

## 2017-01-06 NOTE — Telephone Encounter (Signed)
Received clearance from [facility].Dr.Jordan cleared patient for upcoming colonoscopy.Advised patient cannot stop Xarelto for a minimum of 3 months due to recent bilateral pulmonary emboli.Clearance faxed back to fax # [phone].

## 2017-01-06 NOTE — Telephone Encounter (Signed)
Patient had referral placed for Hematology consult (scheduled at the Cancer [facility])for recurrent PE.  Patient [facility] and the patient stated he was unaware of the referral, refused to schedule an appointment because he does not know why he needs the appointment.  Could someone please call the patient and explain the reason for the referral?.

## 2017-01-07 ENCOUNTER — Other Ambulatory Visit: Payer: Self-pay

## 2017-01-07 DIAGNOSIS — Z9861 Coronary angioplasty status: Secondary | ICD-10-CM

## 2017-01-07 DIAGNOSIS — I2699 Other pulmonary embolism without acute cor pulmonale: Secondary | ICD-10-CM

## 2017-01-07 DIAGNOSIS — I2510 Atherosclerotic heart disease of native coronary artery without angina pectoris: Secondary | ICD-10-CM

## 2017-01-08 ENCOUNTER — Telehealth: Payer: Self-pay | Admitting: Cardiology

## 2017-01-08 NOTE — Telephone Encounter (Signed)
Called patient to schedule followup echo in 1 month and patient refused as he has no insurance and [place] that [place]. Msg sent to Dr. Elvis CoilJordan's nurse.

## 2017-01-12 ENCOUNTER — Telehealth: Payer: Self-pay

## 2017-01-12 ENCOUNTER — Other Ambulatory Visit: Payer: Self-pay | Admitting: Cardiology

## 2017-01-12 MED FILL — METOPROLOL SUCC ER 25 MG TA: 25 | 30 days supply | Qty: 30 | Fill #0 | Status: TO

## 2017-01-12 NOTE — Telephone Encounter (Signed)
Reiterated to patient that referral to Dr. Mosetta PuttFeng was intended to find a cause for his recurring clots. He states he was angry that "no one said a cancer doctor was calling and no one said he said cancer." Educated patient that Dr. Mosetta PuttFeng works at the cancer center and specializes in blood disorders and cancer.  He exclaimed, "If I knew that, none of these issues would have ever happened." He got increasingly irritated, stating he has no money to cover his bills as he has no insurance. He states, "I still owe $30,000 from last year and I haven't been hit with this one yet." He states [place] is [place] to [place] at the end of next month so he can apply for health insurance and maybe "some of this stuff can be covered." He understands the need to see a specialist, but refuses at this time. Instructed him to call if he changes his mind. He stated, "If I die, I die." And hung up the phone.

## 2017-01-12 NOTE — Telephone Encounter (Signed)
-----   Message from Cory Reichertraci R Turner, MD sent at 01/06/2017  2:21 PM EDT ----- Please let patient know that we discussed this at length in the hospital.  He has had several PEs and DVTs and need to find out if he has a blood thinning/thickening problem.  We discussed hematology seeing patient once he was discharged.  Please get him set up again for Dr. Larence PenningFeng  Traci ----- Message ----- From: Malachy MoodFeng, Yan, MD Sent: 01/06/2017   1:03 PM To: Cory Reichertraci R Turner, MD, Darrol Jumphonda G Barrett, PA-C, #  Dr. Mayford Knifeurner,  I remember that you asked your PA Bjorn LoserRhonda to request a hematology consult for his recurrent PE. I agreed to arrange. Please see the message below.  Let pt call us if he changes his mind.   Thanks  Terrace ArabiaYan  ----- Message ----- From: Gloris HamBrailsford, Andrea Y Sent: 01/06/2017   9:31 AM To: Darrol Jumphonda G Barrett, PA-C, Malachy MoodYan Feng, MD  I left Cory Nolan a vm yesterday, [phone] to call me back and schedule an appt.. When he called this morning he was extremely upset because he didn't know why he needed to come to our office. The patient then asked me why he needed to make an appt. I told him he needs to call Ms. Barrett's office for further explanation in which he felt I should've known. I explained to him that I am only a scheduler. He went on to ask if we will be doing a full blood panel on him. I told him that I was unsure what would be done at his appt. The patient then rudely said he will not make an appt. I called the office and told them about the pt not wanting an appt and that someone should explain to him the need for a hematology appt.    ----- Message ----- From: Malachy MoodFeng, Yan, MD Sent: 12/30/2016   9:57 PM To: Darrol Jumphonda G Barrett, PA-C, Nat ChristenAndrea Y Brailsford  Andrea,  Please schedule this pt with any one of us, in 4-6 weeks, for recurrent PE.  Thanks  Terrace ArabiaYan  ----- Message ----- From: Jacinta ShoeBarrett, Rhonda G, PA-C Sent: 12/30/2016   4:56 PM To: Darrol Jumphonda G Barrett, PA-C, Malachy MoodYan Feng, MD  New pt referral.  He has recurrent PE,  needs eval to figure out why he is getting them.  Thank you RGB

## 2017-01-14 ENCOUNTER — Telehealth: Payer: Self-pay | Admitting: Cardiology

## 2017-01-14 NOTE — Telephone Encounter (Signed)
Called the patient and gave him the new date, time and location of his echo.

## 2017-01-15 ENCOUNTER — Other Ambulatory Visit (HOSPITAL_COMMUNITY): Payer: Self-pay

## 2017-01-20 ENCOUNTER — Telehealth: Payer: Self-pay | Admitting: Cardiology

## 2017-01-20 ENCOUNTER — Other Ambulatory Visit: Payer: Self-pay

## 2017-01-20 MED ORDER — RIVAROXABAN 20 MG PO TABS: 20.0000 mg | ORAL_TABLET | Freq: Every day | ORAL | 3 refills | Status: DC

## 2017-01-20 NOTE — Telephone Encounter (Signed)
Cory Nolan is calling because he got approved for assistance with his Xarelto and they need to have the prescription(Xarelto 20mg   Once a day) . He needs a it to show his name and DOB. Please fax to [phone]. Please call if you have any questions   Thanks

## 2017-01-20 NOTE — Telephone Encounter (Signed)
Xarelto 20 mg prescription faxed to patient assistance at fax # [phone].

## 2017-01-26 NOTE — Progress Notes (Deleted)
Hd d

## 2017-01-27 ENCOUNTER — Ambulatory Visit (INDEPENDENT_AMBULATORY_CARE_PROVIDER_SITE_OTHER): Payer: Self-pay | Admitting: Student

## 2017-01-27 ENCOUNTER — Encounter: Payer: Self-pay | Admitting: Student

## 2017-01-27 VITALS — BP 106/76 | HR 76 | Ht 75.0 in | Wt 344.8 lb

## 2017-01-27 DIAGNOSIS — I714 Abdominal aortic aneurysm, without rupture, unspecified: Secondary | ICD-10-CM | POA: Insufficient documentation

## 2017-01-27 DIAGNOSIS — I2699 Other pulmonary embolism without acute cor pulmonale: Secondary | ICD-10-CM

## 2017-01-27 DIAGNOSIS — I10 Essential (primary) hypertension: Secondary | ICD-10-CM

## 2017-01-27 DIAGNOSIS — E785 Hyperlipidemia, unspecified: Secondary | ICD-10-CM | POA: Insufficient documentation

## 2017-01-27 DIAGNOSIS — I2510 Atherosclerotic heart disease of native coronary artery without angina pectoris: Secondary | ICD-10-CM

## 2017-01-27 NOTE — Progress Notes (Signed)
Cardiology Office Note    Date:  01/27/2017   ID:  Cory Nolan, DOB 12-26-1955, MRN [id number]  PCP:  Tommie Sams, DO  Cardiologist: Dr. Swaziland   Chief Complaint  Patient presents with  . Hospitalization Follow-up    History of Present Illness:    Cory Nolan is a 61 y.o. male with past medical history of CAD (s/p DES to RCA in 02/2015), HTN, HLD, AAA (4.4 cm by CT in 12/2016) and bilateral PE (occuring in 02/2015) who presents to the office today for hospital follow-up.  He was examined by Corine Shelter, PA-C on 12/29/2016 for worsening dyspnea at rest and on exertion, reporting having stopped his Coumadin for 2 weeks in the setting of hemorrhoids and having a colonoscopy scheduled. It was recommended he proceed direcetly to the ED but he initially declined. A STAT D-dimer was obtained and elevated at 2.28, therefore he was instructed to go to the ED where a CTA showed multiple bilateral acute PE. GI was consulted as it was thought his colonoscopy should be performed while on Heparin but he declined to have it performed as an inpatient due to not having insurance coverage. He was switched from Heparin to Xarelto as he was unable to afford a Lovenox bridge or INR visits. Assistance paperwork was submitted in regards to Xarelto. He has been referred to Hematology in the outpatient setting to help determine the cause of his recurrent clots.   In talking with the patient today, he reports overall doing well since his recent hospitalization. Reports breathing is close to baseline. He denies any recent chest discomfort, palpitations, orthopnea, PND, or lower extremity edema. He does note an occasional pain along his right lower quadrant which has been present for the past several months. According to the patient, a CT scan was performed and showed no evidence of appendicitis when this was initially occurring. He had schedule colonoscopy for further evaluation and this is been postponed for at  least 6 months in the setting of his recurrent PE.  He reports good compliance with his Xarelto and denies any evidence of active bleeding. Reports his hemorrhoids have resolved. He was approved through the patient assistance program but has yet to receive the actual Rx.    Past Medical History:  Diagnosis Date  . AAA (abdominal aortic aneurysm) (HCC)    a. 4.4 cm by CT in 12/2016  . Bilateral pulmonary embolism, multiple 03/13/15 03/08/2015   a. initially on Coumadin but had recurrent PE's while Coumadin on hold in 12/2016 --> switched to Xarelto  . Coronary artery disease 03/08/2015   a. s/p DES to RCA in 02/2015  . Diabetes mellitus without complication (HCC)   . Hepatic steatosis 03/19/2015  . Hyperlipidemia with target LDL less than 70 03/18/2015  . Iliac artery aneurysm, right (HCC) 03/19/2015  . Morbid obesity (HCC)   . Prediabetes 03/18/2015    Past Surgical History:  Procedure Laterality Date  . CARDIAC CATHETERIZATION N/A 03/08/2015   Procedure: Left Heart Cath and Coronary Angiography;  Surgeon: Peter M Swaziland, MD;  Location: [facility] INVASIVE CV LAB;  Service: Cardiovascular;  Laterality: N/A;  . KNEE SURGERY      Current Medications: Outpatient Medications Prior to Visit  Medication Sig Dispense Refill  . acetaminophen (TYLENOL) 650 MG CR tablet Take 1,300 mg by mouth every 8 (eight) hours as needed for pain.     . diphenhydramine-acetaminophen (TYLENOL PM) 25-500 MG TABS tablet Take 2 tablets by mouth at  bedtime as needed.    . ezetimibe (ZETIA) 10 MG tablet Take 1 tablet (10 mg total) by mouth daily. 30 tablet 6  . Glucosamine HCl (GLUCOSAMINE PO) Take 1 capsule by mouth daily.    . hydrocortisone cream 1 % Apply 1 application topically as needed for itching.    . linaclotide (LINZESS) 290 MCG CAPS capsule Take 290 mcg by mouth daily before breakfast.    . metoprolol succinate (TOPROL-XL) 25 MG 24 hr tablet TAKE 1 TABLET BY MOUTH DAILY. (Patient taking differently: TAKE 1  TABLET AT BEDTIME) 30 tablet 2  . metoprolol succinate (TOPROL-XL) 25 MG 24 hr tablet TAKE 1 TABLET BY MOUTH DAILY. 30 tablet 6  . nitroGLYCERIN (NITROSTAT) 0.4 MG SL tablet Place 1 tablet (0.4 mg total) under the tongue every 5 (five) minutes x 3 doses as needed for chest pain. 25 tablet 3  . Omega-3 Fatty Acids (FISH OIL PO) Take 1 capsule by mouth daily.     . rivaroxaban (XARELTO) 20 MG TABS tablet Take 1 tablet (20 mg total) by mouth daily with supper. 90 tablet 3  . aspirin EC 81 MG tablet Take 81 mg by mouth daily.     No facility-administered medications prior to visit.      Allergies:   Patient has no known allergies.   Social History   Social History  . Marital status: Unknown    Spouse name: N/A  . Number of children: N/A  . Years of education: N/A   Occupational History  . Unemployed, filing for disability    Social History Main Topics  . Smoking status: Former Smoker    Years: 40.00    Quit date: 07/12/2007  . Smokeless tobacco: Current User    Types: Chew  . Alcohol use 0.0 oz/week     Comment: occ  . Drug use: No  . Sexual activity: No   Other Topics Concern  . Not on file   Social History Narrative   Lives alone.     Family History:  The patient's family history includes Bone cancer in his mother; Breast cancer in his mother; Dementia in his father; Heart attack in his mother; Heart disease in his mother; Sudden death (age of onset: 45) in his paternal grandfather.   Review of Systems:   Please see the history of present illness.     General:  No chills, fever, night sweats or weight changes.  Cardiovascular:  No chest pain, edema, orthopnea, palpitations, paroxysmal nocturnal dyspnea. Positive for dyspnea on exertion.  Dermatological: No rash, lesions/masses Respiratory: No cough, dyspnea Urologic: No hematuria, dysuria Abdominal:   No nausea, vomiting, diarrhea, bright red blood per rectum, melena, or hematemesis Neurologic:  No visual changes,  wkns, changes in mental status. All other systems reviewed and are otherwise negative except as noted above.   Physical Exam:    VS:  BP 106/76   Pulse 76   Ht 6\' 3"  (1.905 m)   Wt (!) 344 lb 12.8 oz (156.4 kg)   SpO2 95%   BMI 43.10 kg/m    General: Well developed, obese Caucasian male appearing in no acute distress. Head: Normocephalic, atraumatic, sclera non-icteric, no xanthomas, nares are without discharge.  Neck: No carotid bruits. JVD not elevated.  Lungs: Respirations regular and unlabored, without wheezes or rales.  Heart: Regular rate and rhythm. No S3 or S4.  No murmur, no rubs, or gallops appreciated. Abdomen: Soft, non-tender, non-distended with normoactive bowel sounds. No hepatomegaly. No rebound/guarding. No  obvious abdominal masses. Msk:  Strength and tone appear normal for age. No joint deformities or effusions. Extremities: No clubbing or cyanosis. Trace lower extremity edema.  Distal pedal pulses are 2+ bilaterally. Neuro: Alert and oriented X 3. Moves all extremities spontaneously. No focal deficits noted. Psych:  Responds to questions appropriately with a normal affect. Skin: No rashes or lesions noted  Wt Readings from Last 3 Encounters:  01/27/17 (!) 344 lb 12.8 oz (156.4 kg)  12/31/16 (!) 339 lb 3.2 oz (153.9 kg)  12/29/16 (!) 345 lb 12.8 oz (156.9 kg)     Studies/Labs Reviewed:   EKG:  EKG is not ordered today.   Recent Labs: 09/02/2016: ALT 56 12/29/2016: BUN 11; Creatinine, Ser 0.84; Potassium 4.1; Sodium 134 12/31/2016: Hemoglobin 12.9; Platelets 167   Lipid Panel    Component Value Date/Time   CHOL 163 12/31/2016 0304   TRIG 122 12/31/2016 0304   HDL 30 (L) 12/31/2016 0304   CHOLHDL 5.4 12/31/2016 0304   VLDL 24 12/31/2016 0304   LDLCALC 109 (H) 12/31/2016 0304    Additional studies/ records that were reviewed today include:   CTA: 12/2016 IMPRESSION: 1. Multiple bilateral acute pulmonary emboli. Positive for acute PE with CT evidence  of right heart strain (RV/LV Ratio = 1.0) consistent with at least submassive (intermediate risk) PE. The presence of right heart strain has been associated with an increased risk of morbidity and mortality. Please activate Code PE by paging [phone]. 2. Aneurysmal dilatation of the ascending aorta up to 4.4 cm Recommend annual imaging followup by CTA or MRA. This recommendation follows 2010 ACCF/AHA/AATS/ACR/ASA/SCA/SCAI/SIR/STS/SVM Guidelines for the Diagnosis and Management of Patients with Thoracic Aortic Disease. Circulation. 2010; 121: [id number]  Echocardiogram: 12/2016 Study Conclusions  - Left ventricle: Poor acoustic windows limit study LVEF is normal   at approximaty 55% with mild lateral hypokinesis. The cavity size   was normal. Wall thickness was increased in a pattern of moderate   LVH. Doppler parameters are consistent with abnormal left   ventricular relaxation (grade 1 diastolic dysfunction). - Right ventricle: The cavity size was mildly to moderately   dilated. Systolic function was moderately reduced.  Assessment:    1. Coronary artery disease involving native coronary artery of native heart without angina pectoris   2. Recurrent pulmonary emboli (HCC)   3. Essential hypertension   4. Hyperlipidemia LDL goal <70   5. Abdominal aortic aneurysm (AAA) without rupture (HCC)      Plan:   In order of problems listed above:  1. CAD - s/p DES to RCA in 02/2015. He denies any recent chest pain or dyspnea on exertion since his recent PE.  - continue BB and Zetia (intolerant to statin therapy). Will discontinue ASA as he is on Xarelto and has experienced recent issues with bleeding.   2. Recurrent Pulmonary Emboli - diagnosed with bilateral PE's in 02/2015. He recently self-discontinued his Coumadin due to hemorrhoids and preparation for a colonscopy and was found to have multiple bilateral acute PE's.  - he has been switched from Coumadin to Xarelto. He denies  any evidence of active bleeding since this switch.  - He has been approved through the patient assistance program but has yet to receive the actual Rx. Will provide with samples at today's visit. Is aware he will need to be on uninterrupted anticoagulation for at least 6+ months and will require lifelong anticoagulation with his recurrent events. He was referred to Hematology but says he is not financially stable enough  to see them at this time.   3. HTN - BP remains well-controlled at 106/76 during today's visit. - continue Toprol-XL 25mg  daily.   4. HLD - Lipid Panel in 12/2016 showed total cholesterol of 163, HDL 30, and LDL at 109. - he has been intolerant to multiple statins. Does not have insurance coverage therefore unable to afford PCSK9-inhibitors. Continue Zetia.   5. AAA - 4.4 cm by CT in 12/2016. - continue to follow with annual imaging.   Medication Adjustments/Labs and Tests Ordered: Current medicines are reviewed at length with the patient today.  Concerns regarding medicines are outlined above.  Medication changes, Labs and Tests ordered today are listed in the Patient Instructions below. Patient Instructions  Medication Instructions: Your physician recommends that you continue on your current medications as directed. Please refer to the Current Medication list given to you today.  If you need a refill on your cardiac medications before your next appointment, please call your pharmacy.    Follow-Up: Your physician wants you to follow-up in: 6 months with Dr. Swaziland. You will receive a reminder letter in the mail two months in advance. If you don't receive a letter, please call our office to schedule the follow-up appointment.  Please keep your appointment to have your ECHO done on 02/01/17  Thank you for choosing Heartcare at [facility]!!         Signed, Ellsworth Lennox, PA-C  01/27/2017 7:12 PM    [facility] [address] [place], [address]  [place], [place]  [address] Phone: [phone]; Fax: [phone]  [address] [place], [place] [address] Phone: [phone]

## 2017-01-27 NOTE — Patient Instructions (Addendum)
Medication Instructions: STOP the aspirin.   If you need a refill on your cardiac medications before your next appointment, please call your pharmacy.    Follow-Up: Your physician wants you to follow-up in: 6 months with Dr. SwazilandJordan. You will receive a reminder letter in the mail two months in advance. If you don't receive a letter, please call our office to schedule the follow-up appointment.  Please keep your appointment to have your ECHO done on 02/01/17    Thank you for choosing Heartcare at [facility]!!

## 2017-02-01 ENCOUNTER — Ambulatory Visit (HOSPITAL_COMMUNITY): Payer: Self-pay | Attending: Cardiovascular Disease

## 2017-02-01 ENCOUNTER — Other Ambulatory Visit: Payer: Self-pay

## 2017-02-01 DIAGNOSIS — I2510 Atherosclerotic heart disease of native coronary artery without angina pectoris: Secondary | ICD-10-CM | POA: Insufficient documentation

## 2017-02-01 DIAGNOSIS — I517 Cardiomegaly: Secondary | ICD-10-CM | POA: Insufficient documentation

## 2017-02-01 DIAGNOSIS — Z9861 Coronary angioplasty status: Secondary | ICD-10-CM | POA: Insufficient documentation

## 2017-02-01 DIAGNOSIS — I2699 Other pulmonary embolism without acute cor pulmonale: Secondary | ICD-10-CM | POA: Insufficient documentation

## 2017-02-01 MED ORDER — PERFLUTREN LIPID MICROSPHERE
1.0000 mL | INTRAVENOUS | Status: AC | PRN
  Administered 2017-02-01: 2 mL via INTRAVENOUS

## 2017-02-02 ENCOUNTER — Other Ambulatory Visit: Payer: Self-pay

## 2017-02-02 MED ORDER — RIVAROXABAN 20 MG PO TABS: 20.0000 mg | ORAL_TABLET | Freq: Every day | ORAL | 6 refills | Status: AC

## 2017-02-02 MED FILL — XARELTO 20 MG TABLET: 20 | 30 days supply | Qty: 30 | Fill #0

## 2017-02-08 MED FILL — METOPROLOL SUCC ER 25 MG TA: 25 | 30 days supply | Qty: 30 | Fill #1 | Status: TO

## 2017-02-17 ENCOUNTER — Encounter: Payer: Self-pay | Admitting: Hematology

## 2017-03-12 MED FILL — XARELTO 20 MG TABLET: 20 | 30 days supply | Qty: 30 | Fill #1

## 2017-03-12 MED FILL — METOPROLOL SUCC ER 25 MG TA: 25 | 30 days supply | Qty: 30 | Fill #2 | Status: TO

## 2017-04-13 MED FILL — METOPROLOL SUCC ER 25 MG TA: 25 | 30 days supply | Qty: 30 | Fill #3 | Status: TO

## 2017-04-13 MED FILL — XARELTO 20 MG TABLET: 20 | 30 days supply | Qty: 30 | Fill #2

## 2017-05-17 MED FILL — METOPROLOL SUCC ER 25 MG TA: 25 | 30 days supply | Qty: 30 | Fill #4 | Status: TO

## 2017-05-17 MED FILL — XARELTO 20 MG TABLET: 20 | 30 days supply | Qty: 30 | Fill #3

## 2017-06-11 MED FILL — XARELTO 20 MG TABLET: 20 | 30 days supply | Qty: 30 | Fill #4 | Status: TO

## 2017-06-11 MED FILL — METOPROLOL SUCC ER 25 MG TA: 25 | 30 days supply | Qty: 30 | Fill #5 | Status: TO

## 2017-07-19 ENCOUNTER — Other Ambulatory Visit: Payer: Self-pay | Admitting: Cardiology

## 2017-07-19 MED ORDER — METOPROLOL SUCCINATE ER 25 MG PO TB24: 25.0000 mg | ORAL_TABLET | Freq: Every day | ORAL | 0 refills | Status: DC

## 2017-07-19 NOTE — Telephone Encounter (Signed)
Rx(s) sent to pharmacy electronically.  

## 2017-07-19 NOTE — Telephone Encounter (Signed)
°*  STAT* If patient is at the pharmacy, call can be transferred to refill team.   1. Which medications need to be refilled? (please list name of each medication and dose if known) Metoprolol succinate 25mg   2. Which pharmacy/location (including street and city if local pharmacy) is medication to be sent to? [facility] [address], [place], [place] [phone]  3. Do they need a 30 day or 90 day supply? 90  Patient moving to [place] and is set for a consult with cardiologist there but he will need ext of medication first

## 2017-08-16 ENCOUNTER — Ambulatory Visit: Payer: Self-pay | Admitting: Cardiology

## 2017-11-05 ENCOUNTER — Other Ambulatory Visit: Payer: Self-pay | Admitting: Cardiology

## 2017-12-29 ENCOUNTER — Telehealth: Payer: Self-pay | Admitting: Cardiology

## 2017-12-29 NOTE — Telephone Encounter (Signed)
[facility]ed [facility] Cory Nolan. He states he has not received the patient portion of the application. Advised I will mail this for him to complete & submit and our office will fax the health care provider portion to [phone]. He voiced understanding.

## 2019-03-27 IMAGING — CT CT ANGIO CHEST
2 of 6 series · 18 of 36 positions shown · IV contrast (Omni 300)
Comparison: 03/13/2015

CLINICAL DATA: Shortness of breath history of PE

EXAM:
CT ANGIOGRAPHY CHEST WITH CONTRAST
TECHNIQUE: Multidetector CT imaging of the chest was performed using the
standard protocol during bolus administration of intravenous
contrast. Multiplanar CT image reconstructions and MIPs were
obtained to evaluate the vascular anatomy.
CONTRAST:  100 mL Isovue 370 intravenous

[Series 7: pe thins · axial · 0.79mm/px · z∈[+1402,+1669]mm · 17 of 301 slices shown]
[im 17/301  lung]
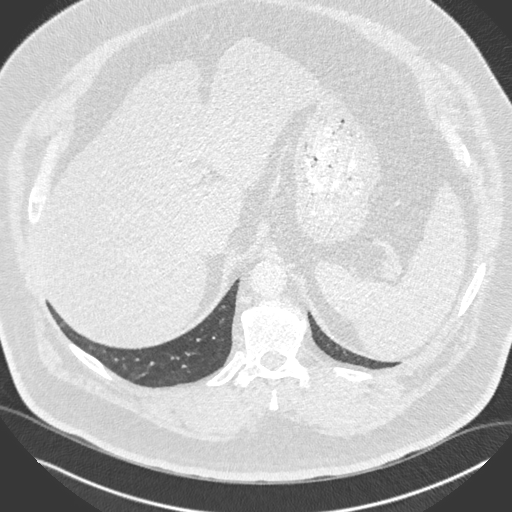
[im 34/301  mediastinal]
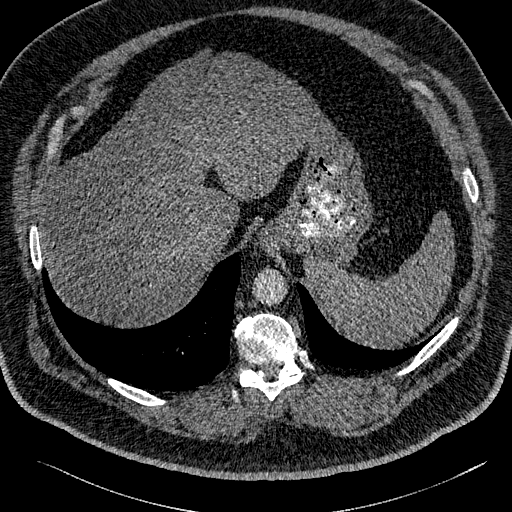
[im 51/301  lung]
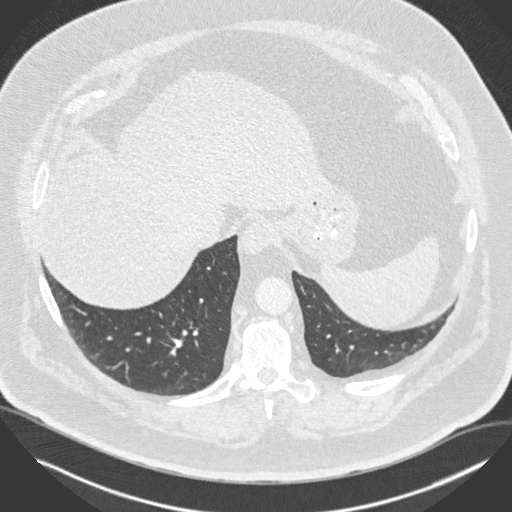
[im 67/301  mediastinal]
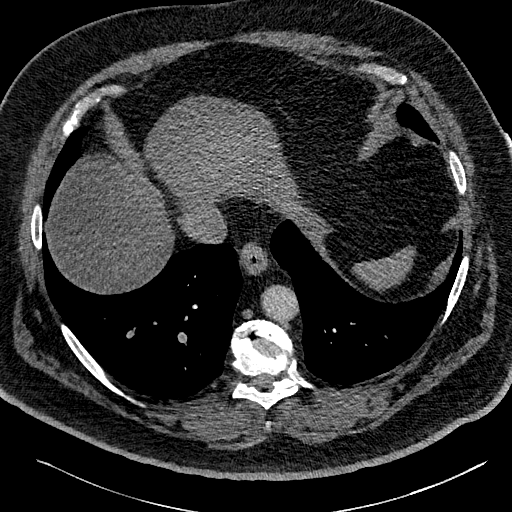
[im 84/301  lung]
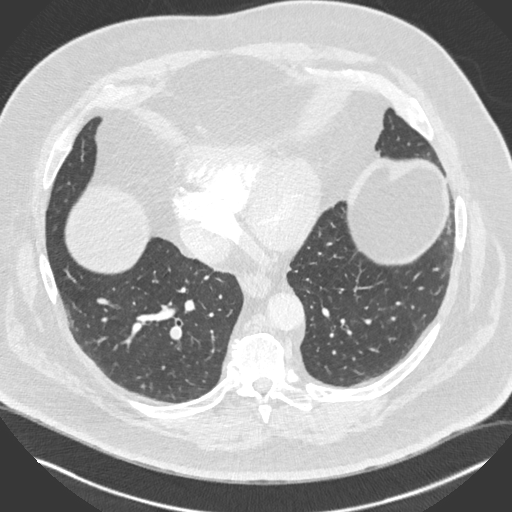
[im 101/301  mediastinal]
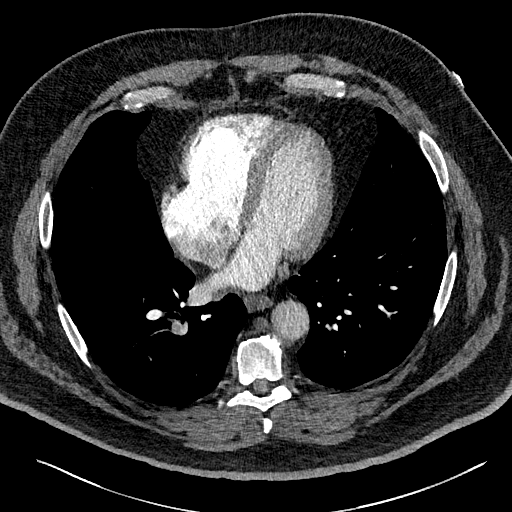
[im 117/301  lung]
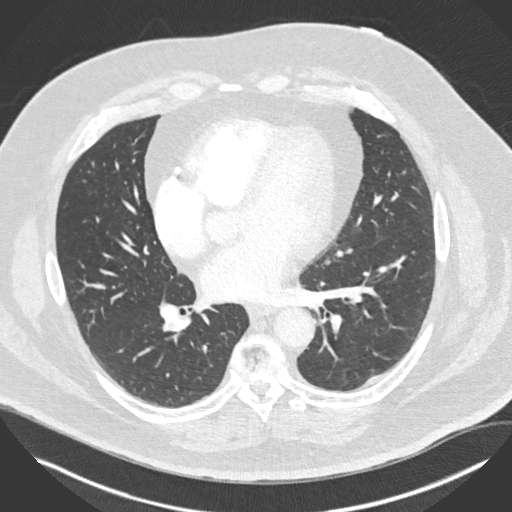
[im 134/301  mediastinal]
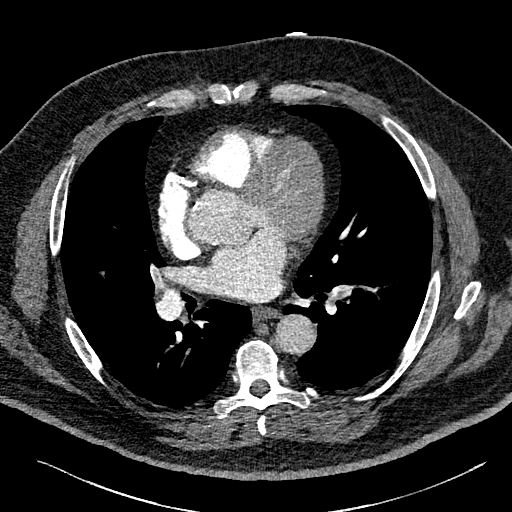
[im 151/301  lung]
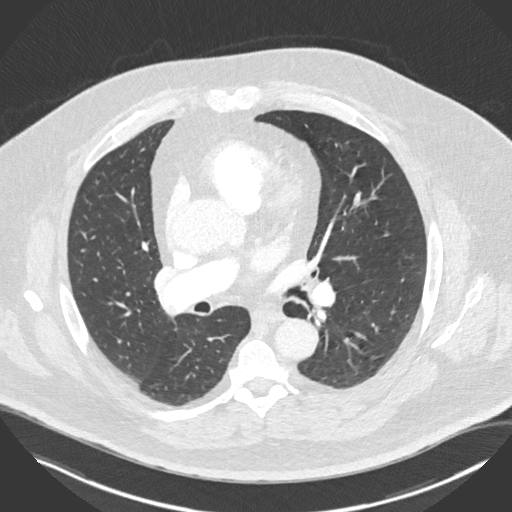
[im 167/301  mediastinal]
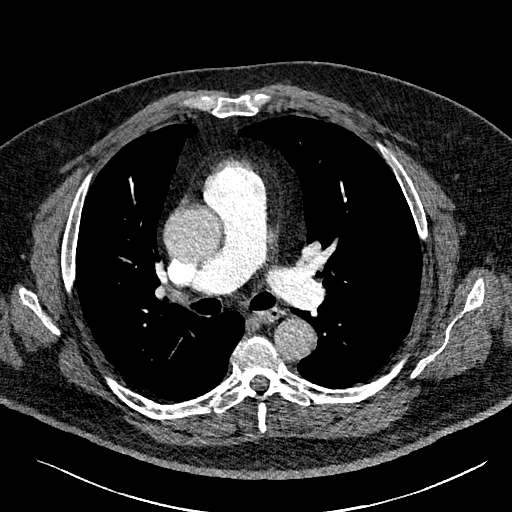
[im 184/301  lung]
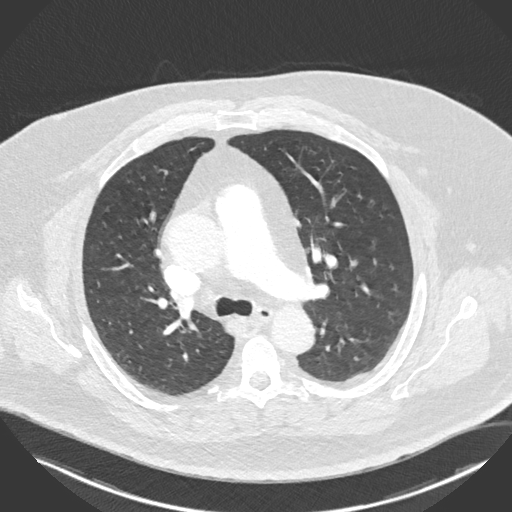
[im 201/301  mediastinal]
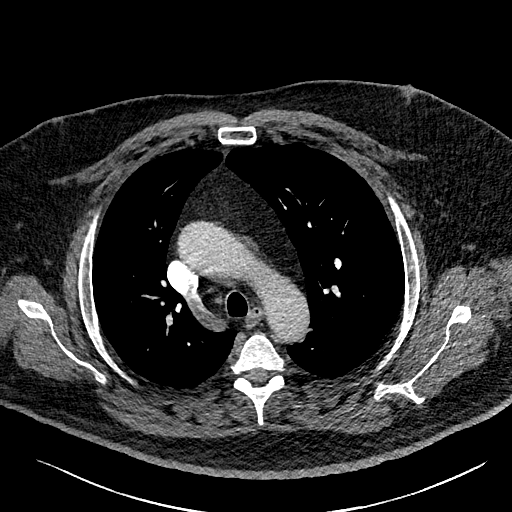
[im 217/301  lung]
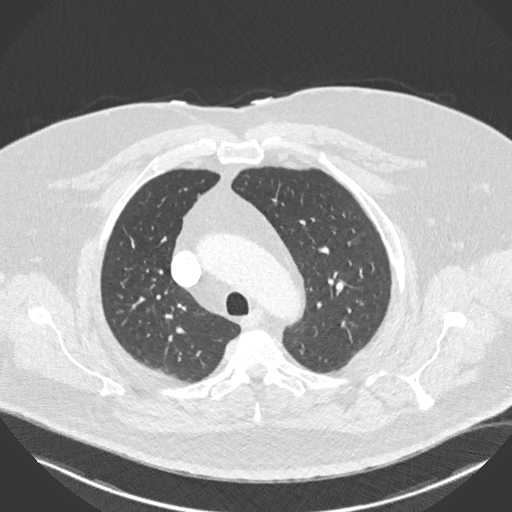
[im 234/301  mediastinal]
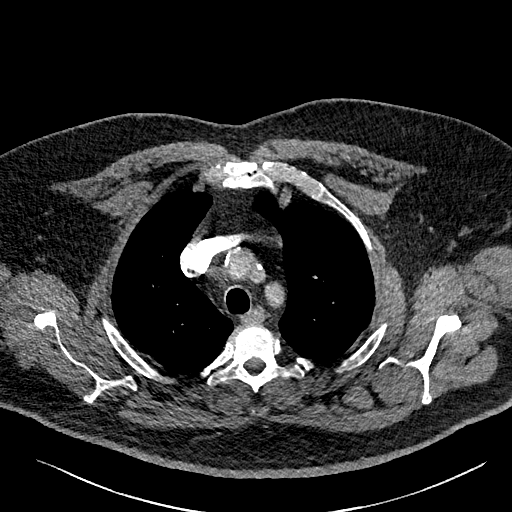
[im 251/301  lung]
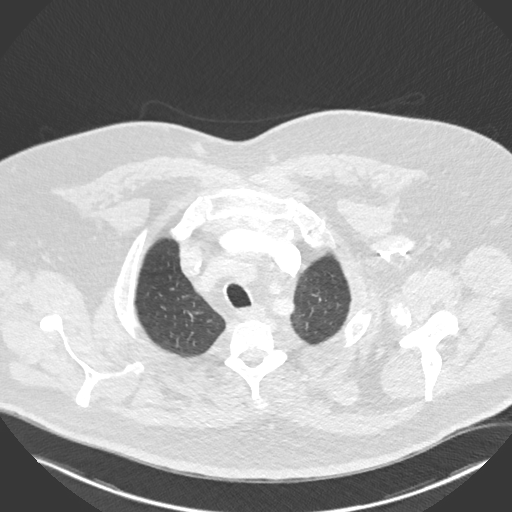
[im 267/301  mediastinal]
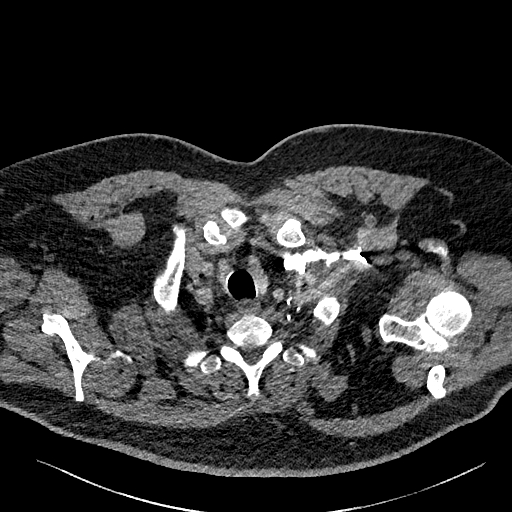
[im 284/301  lung]
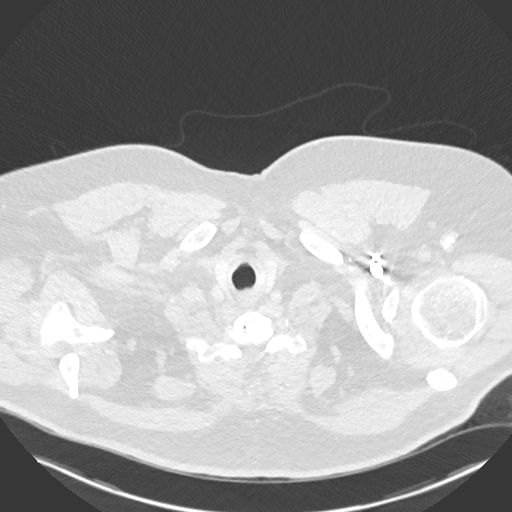

[Series 8: pe 2mm cor · coronal · 0.59mm/px · 1 of 142 slices shown]
[im 71/142  mediastinal]
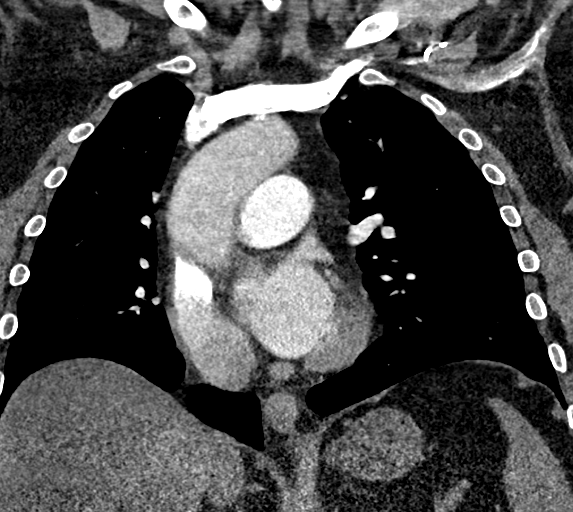

[18 of 36 positions shown; findings below may reference images not displayed]

FINDINGS: Cardiovascular: Satisfactory opacification of the pulmonary arteries
to the segmental level. Multiple filling defects within right upper,
right middle and right lower lobe pulmonary arterial branches.
Multifocal filling defects within left lower lobe and left upper
lobe segmental and subsegmental pulmonary artery branches consistent
with acute pulmonary embolus. RV LV ratio is elevated at
consistent with right heart strain. Mild cardiomegaly. No
pericardial effusion. Coronary artery calcification. Enlarged
pulmonary artery trunk at 3.4 cm. Aortic atherosclerosis. Mild
aneurysmal dilatation of ascending aorta up to 4.4 cm.

Mediastinum/Nodes: No enlarged mediastinal, hilar, or axillary lymph
nodes. Thyroid gland, trachea, and esophagus demonstrate no
significant findings.

Lungs/Pleura: Lungs are clear. No pleural effusion or pneumothorax.

Upper Abdomen: Hepatic steatosis.  No acute abnormality.

Musculoskeletal: Degenerative changes. No acute or suspicious bone
lesion.

Review of the MIP images confirms the above findings.
IMPRESSION: 1. Multiple bilateral acute pulmonary emboli. Positive for acute PE
with CT evidence of right heart strain (RV/LV Ratio = 1.0)
consistent with at least submassive (intermediate risk) PE. The
presence of right heart strain has been associated with an increased
risk of morbidity and mortality. Please activate Code PE by paging
[phone].
2. Aneurysmal dilatation of the ascending aorta up to 4.4 cm
Recommend annual imaging followup by CTA or MRA. This recommendation
follows 6939 ACCF/AHA/AATS/ACR/ASA/SCA/[place]/DUREAU/[place]/TIGER Guidelines
for the Diagnosis and Management of Patients with Thoracic Aortic
Disease. Circulation. 6939; 121: [id number]

Aortic Atherosclerosis ([id number]-XCY.Y).

Critical Value/emergent results were called by telephone at the time
of interpretation on 12/29/2016 at [DATE] to Dr. CLERVIN PAUL ERNST ,
who verbally acknowledged these results.

## 2022-10-02 ENCOUNTER — Other Ambulatory Visit: Payer: Self-pay | Admitting: Internal Medicine

## 2022-10-02 DIAGNOSIS — Z129 Encounter for screening for malignant neoplasm, site unspecified: Secondary | ICD-10-CM

## 2022-11-18 ENCOUNTER — Inpatient Hospital Stay: Admission: RE | Admit: 2022-11-18 | Payer: Self-pay | Source: Ambulatory Visit

## 2022-12-18 ENCOUNTER — Encounter: Payer: Self-pay | Admitting: Internal Medicine

## 2022-12-30 ENCOUNTER — Ambulatory Visit: Payer: Self-pay

## 2023-01-25 ENCOUNTER — Encounter: Payer: Self-pay | Admitting: Internal Medicine

## 2023-01-27 ENCOUNTER — Inpatient Hospital Stay: Admission: RE | Admit: 2023-01-27 | Payer: Medicare PPO | Source: Ambulatory Visit

## 2023-02-19 ENCOUNTER — Ambulatory Visit: Payer: Medicare PPO

## 2023-03-24 ENCOUNTER — Ambulatory Visit: Payer: Medicare PPO

## 2023-04-08 ENCOUNTER — Ambulatory Visit: Payer: Medicare PPO
